# Patient Record
Sex: Female | Born: 1955 | Race: Black or African American | Hispanic: No | Smoking: Never smoker
Health system: Southern US, Community
[De-identification: ages and names within clinical notes are randomized; demographics above are authoritative.]

## PROBLEM LIST (undated history)

## (undated) DIAGNOSIS — M5126 Other intervertebral disc displacement, lumbar region: Secondary | ICD-10-CM

## (undated) DIAGNOSIS — R569 Unspecified convulsions: Secondary | ICD-10-CM

## (undated) DIAGNOSIS — F329 Major depressive disorder, single episode, unspecified: Secondary | ICD-10-CM

## (undated) DIAGNOSIS — M5417 Radiculopathy, lumbosacral region: Secondary | ICD-10-CM

## (undated) DIAGNOSIS — F32A Depression, unspecified: Secondary | ICD-10-CM

## (undated) DIAGNOSIS — E039 Hypothyroidism, unspecified: Secondary | ICD-10-CM

## (undated) DIAGNOSIS — E119 Type 2 diabetes mellitus without complications: Secondary | ICD-10-CM

## (undated) DIAGNOSIS — I10 Essential (primary) hypertension: Secondary | ICD-10-CM

## (undated) DIAGNOSIS — C801 Malignant (primary) neoplasm, unspecified: Secondary | ICD-10-CM

## (undated) DIAGNOSIS — K219 Gastro-esophageal reflux disease without esophagitis: Secondary | ICD-10-CM

## (undated) DIAGNOSIS — M797 Fibromyalgia: Secondary | ICD-10-CM

## (undated) DIAGNOSIS — N289 Disorder of kidney and ureter, unspecified: Secondary | ICD-10-CM

## (undated) DIAGNOSIS — G894 Chronic pain syndrome: Secondary | ICD-10-CM

---

## 2015-08-13 ENCOUNTER — Encounter (HOSPITAL_COMMUNITY): Payer: Self-pay | Admitting: General Practice

## 2015-08-13 ENCOUNTER — Emergency Department (HOSPITAL_COMMUNITY)
Admission: EM | Admit: 2015-08-13 | Discharge: 2015-08-13 | Disposition: A | Discharge status: Home / Self Care | Payer: Medicare Other | Attending: Emergency Medicine | Admitting: Emergency Medicine

## 2015-08-13 ENCOUNTER — Emergency Department (HOSPITAL_COMMUNITY): Payer: Medicare Other

## 2015-08-13 DIAGNOSIS — I10 Essential (primary) hypertension: Secondary | ICD-10-CM | POA: Insufficient documentation

## 2015-08-13 DIAGNOSIS — Z7984 Long term (current) use of oral hypoglycemic drugs: Secondary | ICD-10-CM | POA: Insufficient documentation

## 2015-08-13 DIAGNOSIS — Z85850 Personal history of malignant neoplasm of thyroid: Secondary | ICD-10-CM | POA: Diagnosis not present

## 2015-08-13 DIAGNOSIS — Z8742 Personal history of other diseases of the female genital tract: Secondary | ICD-10-CM | POA: Diagnosis not present

## 2015-08-13 DIAGNOSIS — Z79899 Other long term (current) drug therapy: Secondary | ICD-10-CM | POA: Insufficient documentation

## 2015-08-13 DIAGNOSIS — F329 Major depressive disorder, single episode, unspecified: Secondary | ICD-10-CM | POA: Insufficient documentation

## 2015-08-13 DIAGNOSIS — M797 Fibromyalgia: Secondary | ICD-10-CM | POA: Diagnosis not present

## 2015-08-13 DIAGNOSIS — K219 Gastro-esophageal reflux disease without esophagitis: Secondary | ICD-10-CM | POA: Insufficient documentation

## 2015-08-13 DIAGNOSIS — E119 Type 2 diabetes mellitus without complications: Secondary | ICD-10-CM | POA: Diagnosis not present

## 2015-08-13 DIAGNOSIS — Z8669 Personal history of other diseases of the nervous system and sense organs: Secondary | ICD-10-CM | POA: Insufficient documentation

## 2015-08-13 DIAGNOSIS — G894 Chronic pain syndrome: Secondary | ICD-10-CM | POA: Diagnosis not present

## 2015-08-13 DIAGNOSIS — E039 Hypothyroidism, unspecified: Secondary | ICD-10-CM | POA: Insufficient documentation

## 2015-08-13 DIAGNOSIS — R0602 Shortness of breath: Secondary | ICD-10-CM | POA: Insufficient documentation

## 2015-08-13 DIAGNOSIS — R0789 Other chest pain: Secondary | ICD-10-CM | POA: Insufficient documentation

## 2015-08-13 DIAGNOSIS — Z760 Encounter for issue of repeat prescription: Secondary | ICD-10-CM | POA: Insufficient documentation

## 2015-08-13 DIAGNOSIS — R079 Chest pain, unspecified: Secondary | ICD-10-CM | POA: Diagnosis present

## 2015-08-13 DIAGNOSIS — G8929 Other chronic pain: Secondary | ICD-10-CM

## 2015-08-13 HISTORY — DX: Unspecified convulsions: R56.9

## 2015-08-13 HISTORY — DX: Gastro-esophageal reflux disease without esophagitis: K21.9

## 2015-08-13 HISTORY — DX: Radiculopathy, lumbosacral region: M54.17

## 2015-08-13 HISTORY — DX: Other intervertebral disc displacement, lumbar region: M51.26

## 2015-08-13 HISTORY — DX: Disorder of kidney and ureter, unspecified: N28.9

## 2015-08-13 HISTORY — DX: Depression, unspecified: F32.A

## 2015-08-13 HISTORY — DX: Essential (primary) hypertension: I10

## 2015-08-13 HISTORY — DX: Chronic pain syndrome: G89.4

## 2015-08-13 HISTORY — DX: Malignant (primary) neoplasm, unspecified: C80.1

## 2015-08-13 HISTORY — DX: Hypothyroidism, unspecified: E03.9

## 2015-08-13 HISTORY — DX: Fibromyalgia: M79.7

## 2015-08-13 HISTORY — DX: Major depressive disorder, single episode, unspecified: F32.9

## 2015-08-13 HISTORY — DX: Type 2 diabetes mellitus without complications: E11.9

## 2015-08-13 LAB — CBG MONITORING, ED: GLUCOSE-CAPILLARY: 112 mg/dL — AB (ref 65–99)

## 2015-08-13 LAB — BASIC METABOLIC PANEL
ANION GAP: 8 (ref 5–15)
BUN: <5 mg/dL — ABNORMAL LOW (ref 6–20)
CHLORIDE: 106 mmol/L (ref 101–111)
CO2: 27 mmol/L (ref 22–32)
Calcium: 9.2 mg/dL (ref 8.9–10.3)
Creatinine, Ser: 0.7 mg/dL (ref 0.44–1.00)
GFR calc non Af Amer: >60 mL/min (ref >60)
Glucose, Bld: 115 mg/dL — ABNORMAL HIGH (ref 65–99)
Potassium: 3.5 mmol/L (ref 3.5–5.1)
SODIUM: 141 mmol/L (ref 135–145)

## 2015-08-13 LAB — I-STAT TROPONIN, ED
TROPONIN I, POC: 0.01 ng/mL (ref 0.00–0.08)
Troponin i, poc: 0 ng/mL (ref 0.00–0.08)

## 2015-08-13 LAB — CBC
HEMATOCRIT: 35.4 % — AB (ref 36.0–46.0)
HEMOGLOBIN: 11.1 g/dL — AB (ref 12.0–15.0)
MCH: 26.7 pg (ref 26.0–34.0)
MCHC: 31.4 g/dL (ref 30.0–36.0)
MCV: 85.3 fL (ref 78.0–100.0)
Platelets: 218 10*3/uL (ref 150–400)
RBC: 4.15 MIL/uL (ref 3.87–5.11)
RDW: 14.2 % (ref 11.5–15.5)
WBC: 7 10*3/uL (ref 4.0–10.5)

## 2015-08-13 MED ORDER — METFORMIN HCL 500 MG PO TABS: 500.0000 mg | ORAL_TABLET | Freq: Two times a day (BID) | ORAL | Status: DC

## 2015-08-13 MED ORDER — METOPROLOL TARTRATE 25 MG PO TABS: 25.0000 mg | ORAL_TABLET | Freq: Two times a day (BID) | ORAL | Status: DC

## 2015-08-13 MED ORDER — LEVOTHYROXINE SODIUM 175 MCG PO TABS: 175.0000 ug | ORAL_TABLET | Freq: Every day | ORAL | Status: DC

## 2015-08-13 MED ORDER — HYDROCHLOROTHIAZIDE 25 MG PO TABS: 25.0000 mg | ORAL_TABLET | Freq: Every day | ORAL | Status: DC

## 2015-08-13 NOTE — ED Notes (Signed)
Pt presents with complaints of chest pain and SOB that started two days. Pt reports chest pain as a sharp pain in her left chest, that is nonradiating. Pt rating pain a 4/10. Pt recently moved from [place], and does not have a primary care provider. Pt reports some nausea, but denies any vomiting.

## 2015-08-13 NOTE — ED Provider Notes (Signed)
CSN: [id number]     Arrival date & time 08/13/15  5631 History   First MD Initiated Contact with Patient 08/13/15 [phone]     Chief Complaint  Patient presents with  . Chest Pain     (Consider location/radiation/quality/duration/timing/severity/associated sxs/prior Treatment) HPI Comments: 59 year old female with history of high blood pressure, hypothyroidism, fibromyalgia, chronic pain, pain specialist in [place], reflux, thyroid cancer no active treatment except for supplementation presents for intermittent chest pain shortness of breath for the past few weeks. Clarified nursing note. Patient has been stressed recently and moved back from [place] has no primary doctor is run out of all her medications. Patient has no exertional symptoms, no cardiac history known. Takes marijuana for pain. No vomiting or abdominal pain. No specific associations of the pain. Unsure recent cardiac workup. Chronic pelvic pain similar to previous and bodyaches. Chest pain sharp at times nonradiating.  Patient is a 59 y.o. female presenting with chest pain. The history is provided by the patient.  Chest Pain Associated symptoms: shortness of breath   Associated symptoms: no abdominal pain, no back pain, no fever, no headache, not vomiting and no weakness     Past Medical History  Diagnosis Date  . Hypertension   . Hypothyroidism   . Fibromyalgia   . Renal disorder   . Diabetes mellitus without complication ([place])   . Depression   . GERD (gastroesophageal reflux disease)   . Seizures ([place])   . Cancer ([place])   . Lumbosacral neuritis   . Disc displacement, lumbar   . Chronic pain syndrome    History reviewed. No pertinent past surgical history. No family history on file. Social History  Substance Use Topics  . Smoking status: Never Smoker   . Smokeless tobacco: None  . Alcohol Use: 8.4 oz/week    14 Cans of beer per week   OB History    No data available     Review of Systems  Constitutional:  Negative for fever and chills.  HENT: Negative for congestion.   Eyes: Negative for visual disturbance.  Respiratory: Positive for shortness of breath.   Cardiovascular: Positive for chest pain. Negative for leg swelling.  Gastrointestinal: Negative for vomiting and abdominal pain.  Genitourinary: Negative for dysuria and flank pain.  Musculoskeletal: Negative for back pain, neck pain and neck stiffness.  Skin: Negative for rash.  Neurological: Negative for weakness, light-headedness and headaches.      Allergies  Darvon and Tylenol  Home Medications   Prior to Admission medications   Medication Sig Start Date End Date Taking? Authorizing Provider  busPIRone (BUSPAR) 15 MG tablet Take 15 mg by mouth 3 (three) times daily.   Yes Historical Provider, MD  desvenlafaxine (PRISTIQ) 50 MG 24 hr tablet Take 50 mg by mouth daily.   Yes Historical Provider, MD  FLUoxetine (PROZAC) 20 MG capsule Take 20 mg by mouth daily.   Yes Historical Provider, MD  pantoprazole (PROTONIX) 40 MG tablet Take 40 mg by mouth daily.   Yes Historical Provider, MD  pregabalin (LYRICA) 150 MG capsule Take 150 mg by mouth 2 (two) times daily.   Yes Historical Provider, MD  traMADol (ULTRAM) 50 MG tablet Take 50 mg by mouth every 6 (six) hours as needed.   Yes Historical Provider, MD  vitamin B-12 (CYANOCOBALAMIN) 100 MCG tablet Take 100 mcg by mouth daily.   Yes Historical Provider, MD  vitamin C (ASCORBIC ACID) 500 MG tablet Take 500 mg by mouth daily.   Yes Historical  Provider, MD  hydrochlorothiazide (HYDRODIURIL) 25 MG tablet Take 1 tablet (25 mg total) by mouth daily. 08/13/15   Elnora Morrison, MD  levothyroxine (SYNTHROID, LEVOTHROID) 175 MCG tablet Take 1 tablet (175 mcg total) by mouth daily before breakfast. 08/13/15   Elnora Morrison, MD  metFORMIN (GLUCOPHAGE) 500 MG tablet Take 1 tablet (500 mg total) by mouth 2 (two) times daily with a meal. 08/13/15   Elnora Morrison, MD  metoprolol tartrate (LOPRESSOR) 25  MG tablet Take 1 tablet (25 mg total) by mouth 2 (two) times daily. 08/13/15   Elnora Morrison, MD   BP 156/76 mmHg  Pulse 51  Resp 26  SpO2 97% Physical Exam  Constitutional: She is oriented to person, place, and time. She appears well-developed and well-nourished.  HENT:  Head: Normocephalic and atraumatic.  Eyes: Conjunctivae are normal. Right eye exhibits no discharge. Left eye exhibits no discharge.  Neck: Normal range of motion. Neck supple. No tracheal deviation present.  Cardiovascular: Normal rate and regular rhythm.   Pulmonary/Chest: Effort normal and breath sounds normal.  Abdominal: Soft. She exhibits no distension. There is no tenderness. There is no guarding.  Musculoskeletal: She exhibits no edema.  Neurological: She is alert and oriented to person, place, and time.  Skin: Skin is warm. No rash noted.  Psychiatric: She has a normal mood and affect.  Nursing note and vitals reviewed.   ED Course  Procedures (including critical care time)   EMERGENCY DEPARTMENT [place] CARDIAC EXAM "Study: Limited Ultrasound of the heart and pericardium"  INDICATIONS:Dyspnea Multiple views of the heart and pericardium are obtained with a multi-frequency probe.  PERFORMED GE:ZMOQHU  IMAGES ARCHIVED?: Yes  FINDINGS: No pericardial effusion and Normal contractility  LIMITATIONS:  Body habitus  VIEWS USED: Subcostal 4 chamber, Parasternal long axis, Parasternal short axis and Apical 4 chamber   INTERPRETATION: Cardiac activity present and Pericardial effusioin absent  COMMENT:    Labs Review Labs Reviewed  CBC - Abnormal; Notable for the following:    Hemoglobin 11.1 (*)    HCT 35.4 (*)    All other components within normal limits  BASIC METABOLIC PANEL - Abnormal; Notable for the following:    Glucose, Bld 115 (*)    BUN <5 (*)    All other components within normal limits  CBG MONITORING, ED - Abnormal; Notable for the following:    Glucose-Capillary 112 (*)    All other  components within normal limits  I-STAT TROPOININ, ED  I-STAT TROPOININ, ED  Randolm Idol, ED    Imaging Review Dg Chest 2 View  08/13/2015  CLINICAL DATA:  Chest pain. EXAM: CHEST  2 VIEW COMPARISON:  None. FINDINGS: There is mild enlargement of the cardiac silhouette. There is mild tortuosity of the thoracic aorta. Otherwise normal mediastinal contour. No pneumothorax. No pleural effusion. No pulmonary edema. Mild curvilinear opacities at both lung bases. No focal lung consolidation. Surgical clips are noted in medial bilateral lower neck. IMPRESSION: 1. Mild enlargement of the cardiac silhouette, without pulmonary edema. Cannot exclude pericardial effusion. 2. Mild curvilinear opacities at both lung bases, in keeping with scarring and/or atelectasis. No focal lung consolidation. Electronically Signed   By: Ilona Sorrel M.D.   On: 08/13/2015 11:11   I have personally reviewed and evaluated these images and lab results as part of my medical decision-making.   EKG Interpretation None     EKG reviewed sinus rhythm heart rate 64, no acute ST elevation, no ST depression, normal axis. Normal QT. MDM  Final diagnoses:  Chest pain, atypical  Medication refill  Chronic pain   Patient presents with intermittent atypical chest pain worse with stress for the past 2 weeks. No active chest pain currently. Patient is interested in local Dr. to help follow up her care and prescribed medications. Social work consult at for assistance. Plan for cardiac screen in the ER. With pain for 2 weeks atypical discussed close follow-up outpatient for further evaluation and possible stress test. Very low risk blood clot no active cancer treatment no recent surgery no leg symptoms, not requiring oxygen.  Chest x-ray reviewed mild cardiomegaly bedside ultrasound no significant effusion. Patient comes with outpatient follow-up. Social work discussed helping with medication refills.  Results and differential  diagnosis were discussed with the patient/parent/guardian. Xrays were independently reviewed by myself.  Close follow up outpatient was discussed, comfortable with the plan.   Medications - No data to display  Filed Vitals:   08/13/15 1000 08/13/15 1130 08/13/15 1145  BP: 118/68 141/63 156/76  Pulse: 67 66 51  Resp: 20 11 26   SpO2: 97% 93% 97%    Final diagnoses:  Chest pain, atypical  Medication refill  Chronic pain       Elnora Morrison, MD 08/13/15 1257

## 2015-08-13 NOTE — Clinical Social Work Note (Signed)
Clinical Social Work Assessment  Patient Details  Name: Debbie Boyer MRN: [id number] Date of Birth: [birth year]  Date of referral:  08/13/15               Reason for consult:  Insurance Barriers, Other (Comment Required) (Establishing care due to recent relocation to [facility])                Permission sought to share information with:  Case Manager, Other Permission granted to share information::  Yes, Verbal Permission Granted  Name::        Agency::     Relationship::     Contact Information:     Housing/Transportation Living arrangements for the past 2 months:  Apartment Source of Information:  Patient, Medical Team Patient Interpreter Needed:  None Criminal Activity/Legal Involvement Pertinent to Current Situation/Hospitalization:  No - Comment as needed Significant Relationships:  Adult Children, Other Family Members Lives with:  Adult Children Do you feel safe going back to the place where you live?  Yes Need for family participation in patient care:  No (Coment)  Care giving concerns:  Patient reports no concerns noted at this time except in establishing care with medical provider to keep herself out of the hospital. Reports history of multiple chronic illnesses that she takes medications for and needing care since she has recently located to [facility].  Patient lives with her adult children and grandchildren.   Social Worker assessment / plan:    LCSW met with patient and completed assessment with patient. Patient reports she will be in her new home the first of November and currently trying to get established with medical and psychiatric care within the community. No acute safety issues reported or needed at this time.  Has good support with family and open to resources per report.  LCSW will give information for referrals as well as information about switching medicaid. No barriers to DC. Patient takes bus currently as she waits for her car to ship from [place]. Bus passes given  to patient for transport.   Employment status:  Disabled (Comment on whether or not currently receiving Disability) Insurance information:  Medicare, [employer] PT Recommendations:  Not assessed at this time Information / Referral to community resources:  Outpatient Psychiatric Care (Comment Required), Other (Comment Required) (PCP in community to establish care, Medicaid information to switch Medicaid to [place] from [facility])  Patient/Family's Response to care:  Agreeable to plan Patient/Family's Understanding of and Emotional Response to Diagnosis, Current Treatment, and Prognosis:  Patient aware of her need to be established in community to prevent hospitalization and care for herself adequately.  She is being proactive and able to express her increased stress, pain, and anxiety all associated with adjusting to the new area.    Emotional Assessment Appearance:  Appears stated age Attitude/Demeanor/Rapport:  Other (Very cooperative and pleasant) Affect (typically observed):  Anxious, Stable (hopeful to establish life in [place], happy to be out of [place] per report) Orientation:  Oriented to Self, Oriented to Place, Oriented to  Time, Oriented to Situation Alcohol / Substance use:  Not Applicable Psych involvement (Current and /or in the community):  Outpatient Provider (had an outpatient provider in [facility] and in need of establishing care)  Discharge Needs  Concerns to be addressed:  Care Coordination, Financial / Insurance Concerns Readmission within the last 30 days:  No Current discharge risk:  None Barriers to Discharge:  Barriers Resolved (Community resources given to patient to establish care in  [place])   [place], LCSW 08/13/2015, 11:34 AM

## 2015-08-13 NOTE — Discharge Instructions (Signed)
If you were given medicines take as directed.  If you are on coumadin or contraceptives realize their levels and effectiveness is altered by many different medicines.  If you have any reaction (rash, tongues swelling, other) to the medicines stop taking and see a physician.    If your blood pressure was elevated in the ER make sure you follow up for management with a primary doctor or return for chest pain, shortness of breath or stroke symptoms.  Please follow up as directed and return to the ER or see a physician for new or worsening symptoms.  Thank you. Filed Vitals:   08/13/15 1000 08/13/15 1130 08/13/15 1145  BP: 118/68 141/63 156/76  Pulse: 67 66 51  Resp: 20 11 26   SpO2: 97% 93% 97%

## 2015-08-22 ENCOUNTER — Encounter (HOSPITAL_COMMUNITY): Payer: Self-pay | Admitting: General Practice

## 2015-08-22 ENCOUNTER — Emergency Department (HOSPITAL_COMMUNITY): Payer: Medicare Other

## 2015-08-22 ENCOUNTER — Emergency Department (HOSPITAL_COMMUNITY)
Admission: EM | Admit: 2015-08-22 | Discharge: 2015-08-22 | Disposition: A | Discharge status: Home / Self Care | Payer: Medicare Other | Attending: Emergency Medicine | Admitting: Emergency Medicine

## 2015-08-22 DIAGNOSIS — R0682 Tachypnea, not elsewhere classified: Secondary | ICD-10-CM | POA: Insufficient documentation

## 2015-08-22 DIAGNOSIS — R110 Nausea: Secondary | ICD-10-CM | POA: Diagnosis not present

## 2015-08-22 DIAGNOSIS — R52 Pain, unspecified: Secondary | ICD-10-CM | POA: Diagnosis present

## 2015-08-22 DIAGNOSIS — E119 Type 2 diabetes mellitus without complications: Secondary | ICD-10-CM | POA: Insufficient documentation

## 2015-08-22 DIAGNOSIS — M797 Fibromyalgia: Secondary | ICD-10-CM | POA: Insufficient documentation

## 2015-08-22 DIAGNOSIS — Z79899 Other long term (current) drug therapy: Secondary | ICD-10-CM | POA: Diagnosis not present

## 2015-08-22 DIAGNOSIS — R079 Chest pain, unspecified: Secondary | ICD-10-CM | POA: Insufficient documentation

## 2015-08-22 DIAGNOSIS — G8929 Other chronic pain: Secondary | ICD-10-CM

## 2015-08-22 DIAGNOSIS — Z87448 Personal history of other diseases of urinary system: Secondary | ICD-10-CM | POA: Diagnosis not present

## 2015-08-22 DIAGNOSIS — Z859 Personal history of malignant neoplasm, unspecified: Secondary | ICD-10-CM | POA: Diagnosis not present

## 2015-08-22 DIAGNOSIS — E039 Hypothyroidism, unspecified: Secondary | ICD-10-CM | POA: Insufficient documentation

## 2015-08-22 DIAGNOSIS — K219 Gastro-esophageal reflux disease without esophagitis: Secondary | ICD-10-CM | POA: Insufficient documentation

## 2015-08-22 DIAGNOSIS — G894 Chronic pain syndrome: Secondary | ICD-10-CM | POA: Diagnosis not present

## 2015-08-22 DIAGNOSIS — F329 Major depressive disorder, single episode, unspecified: Secondary | ICD-10-CM | POA: Diagnosis not present

## 2015-08-22 DIAGNOSIS — I10 Essential (primary) hypertension: Secondary | ICD-10-CM | POA: Diagnosis not present

## 2015-08-22 LAB — COMPREHENSIVE METABOLIC PANEL
ALT: 12 U/L — ABNORMAL LOW (ref 14–54)
ANION GAP: 13 (ref 5–15)
AST: 21 U/L (ref 15–41)
Albumin: 3.8 g/dL (ref 3.5–5.0)
Alkaline Phosphatase: 70 U/L (ref 38–126)
BUN: 6 mg/dL (ref 6–20)
CALCIUM: 10.1 mg/dL (ref 8.9–10.3)
CHLORIDE: 103 mmol/L (ref 101–111)
CO2: 25 mmol/L (ref 22–32)
Creatinine, Ser: 0.74 mg/dL (ref 0.44–1.00)
Glucose, Bld: 130 mg/dL — ABNORMAL HIGH (ref 65–99)
POTASSIUM: 2.8 mmol/L — AB (ref 3.5–5.1)
Sodium: 141 mmol/L (ref 135–145)
TOTAL PROTEIN: 8.9 g/dL — AB (ref 6.5–8.1)
Total Bilirubin: 0.6 mg/dL (ref 0.3–1.2)

## 2015-08-22 LAB — CBC
HCT: 40.2 % (ref 36.0–46.0)
HEMOGLOBIN: 13.1 g/dL (ref 12.0–15.0)
MCH: 27.5 pg (ref 26.0–34.0)
MCHC: 32.6 g/dL (ref 30.0–36.0)
MCV: 84.3 fL (ref 78.0–100.0)
PLATELETS: 239 10*3/uL (ref 150–400)
RBC: 4.77 MIL/uL (ref 3.87–5.11)
RDW: 14.6 % (ref 11.5–15.5)
WBC: 10.7 10*3/uL — ABNORMAL HIGH (ref 4.0–10.5)

## 2015-08-22 LAB — I-STAT TROPONIN, ED: TROPONIN I, POC: 0 ng/mL (ref 0.00–0.08)

## 2015-08-22 MED ORDER — ONDANSETRON 4 MG PO TBDP
4.0000 mg | ORAL_TABLET | Freq: Once | ORAL | Status: AC
  Administered 2015-08-22: 4 mg via ORAL
  Filled 2015-08-22: qty 1

## 2015-08-22 MED ORDER — ONDANSETRON HCL 4 MG/2ML IJ SOLN: 4.0000 mg | Freq: Once | INTRAMUSCULAR | Status: DC | PRN

## 2015-08-22 MED ORDER — GI COCKTAIL ~~LOC~~
30.0000 mL | Freq: Once | ORAL | Status: AC
  Administered 2015-08-22: 30 mL via ORAL
  Filled 2015-08-22: qty 30

## 2015-08-22 MED ORDER — HYDROMORPHONE HCL 1 MG/ML IJ SOLN
1.0000 mg | Freq: Once | INTRAMUSCULAR | Status: AC
  Administered 2015-08-22: 1 mg via INTRAMUSCULAR
  Filled 2015-08-22: qty 1

## 2015-08-22 MED ORDER — ASPIRIN 81 MG PO CHEW: 324.0000 mg | CHEWABLE_TABLET | Freq: Once | ORAL | Status: DC

## 2015-08-22 MED ORDER — DIPHENHYDRAMINE HCL 50 MG/ML IJ SOLN: 25.0000 mg | Freq: Once | INTRAMUSCULAR | Status: DC

## 2015-08-22 NOTE — ED Notes (Signed)
Pt presents to the emergency room complaining of pain all over. Pt has a history of fibromyalgia. Pt recently moved from [place] and doesn't have a primary care provider yet to refill her lyrica medication.

## 2015-08-22 NOTE — Discharge Instructions (Signed)
You have been seen today for overall body pain. Your imaging and lab tests showed no abnormalities. Follow up with PCP as soon as possible. Please keep your appointment scheduled for Friday, October 26. Return to ED should symptoms worsen.    Emergency Department Resource Guide 1) Find a Doctor and Pay Out of Pocket Although you won't have to find out who is covered by your insurance plan, it is a good idea to ask around and get recommendations. You will then need to call the office and see if the doctor you have chosen will accept you as a new patient and what types of options they offer for patients who are self-pay. Some doctors offer discounts or will set up payment plans for their patients who do not have insurance, but you will need to ask so you aren't surprised when you get to your appointment.  2) Contact Your Local Health Department Not all health departments have doctors that can see patients for sick visits, but many do, so it is worth a call to see if yours does. If you don't know where your local health department is, you can check in your phone book. The CDC also has a tool to help you locate your state's health department, and many state websites also have listings of all of their local health departments.  3) Find a [facility] If your illness is not likely to be very severe or complicated, you may want to try a walk in clinic. These are popping up all over the country in pharmacies, drugstores, and shopping centers. They're usually staffed by nurse practitioners or physician assistants that have been trained to treat common illnesses and complaints. They're usually fairly quick and inexpensive. However, if you have serious medical issues or chronic medical problems, these are probably not your best option.  No Primary Care Doctor: - Call Health Connect at  [phone] - they can help you locate a primary care doctor that  accepts your insurance, provides certain services,  etc. - Physician Referral Service- [phone]  Chronic Pain Problems: Organization         Address  Phone   Notes  [facility]  [phone] Patients need to be referred by their primary care doctor.   Medication Assistance: Organization         Address  Phone   Notes  [facility] Medication [facility] [address] [place], [place] [address] [phone] --Must be a resident of [facility] -- Must have NO insurance coverage whatsoever (no Medicaid/ Medicare, etc.) -- The pt. MUST have a primary care doctor that directs their care regularly and follows them in the community   [facility]ist  [phone]   [employer]  [phone]    Agencies that provide inexpensive medical care: Organization         Address  Phone   Notes  [place]  [phone]   [facility]    [phone]   [facility] [address] [place], [place] [address] [phone]   [place] [address], [place] [phone]   Planned Parenthood    [phone]   [facility]    [phone]   [place] and [place] [address], [place] Phone:  [phone], Fax:  [phone] Hours of Operation:  9 am - 6 pm, M-F.  Also accepts Medicaid/Medicare  and self-pay.  [facility] for [place] [address], [address], [place] Phone: [phone], Fax: [phone]. Hours of Operation:  8:30 am - 5:30 pm, M-F.  Also accepts Medicaid and self-pay.  [facility] [place] [address], [place] Phone: [phone]   [place], [place], [place] [phone], Ext. [phone] Mondays & Thursdays: 7-9 AM.  First 15 patients are seen on a first come, first serve basis.    [place] Providers:  Organization         Address  Phone   Notes  [facility] [address], [place] [phone] Also accepts self-pay patients.  [facility] [address], [place]  [phone]   [place], [address], [place] [phone]   [facility] [address] [place] [phone]   Lucianne Lei [address], [place]   [phone] Only accepts [place] Access [place] patients after they have their name applied to their card.   Self-Pay (no insurance) in [facility]:  Organization         Address  Phone   Notes  Sickle Cell Patients, [facility] [place] [phone]   [facility] [place] [phone]   [facility] [place]  [place], [place], [place] [phone]   [facility]/Dr. Osei-Bonsu  [address] [place] or [address], [place] [phone] Phone number for both [place] and [place] locations is the same.  [facility] [address] [place] [phone]   [facility] [address] [place] or [address] [phone]   [facility] [place] [address] [place] [phone], phone; [phone], fax Sees patients 1st and 3rd Saturday of every month.  Must not qualify for public or private insurance (i.e. Medicaid, Medicare, [place] Health Choice, Veterans' Benefits)  Household income should be no more than 200% of the poverty level The clinic cannot treat you if you are pregnant or think you are pregnant  Sexually transmitted diseases are not treated at the clinic.    Dental Care: Organization         Address  Phone  Notes  [facility] Department of [facility] [phone] Accepts children up to  age 26 who are enrolled in [place] or [place]; pregnant women with a Medicaid card; and children who have applied for Medicaid or [place] Health Choice, but were declined, whose parents can pay a reduced fee at time of service.  [facility] Department of [facility]  [address], [place] [phone] Accepts children up to age 43 who are enrolled in [place] or [place]; pregnant women with a Medicaid card; and children who have applied for Medicaid or [place] Health Choice, but were declined, whose parents can pay a reduced fee at time of service.  [facility]  [place] [phone] Patients are seen by appointment only. Walk-ins are not accepted. [place] will see patients 55 years of age and older. Monday - Tuesday (8am-5pm) Most Wednesdays (  8:30-5pm) $30 per visit, cash only  [facility] Adult [facility] Access PRO[facility]  [address], [facility] [phone] Patients are seen by appointment only. Walk-ins are not accepted. [place] will see patients 72 years of age and older. One Wednesday Evening (Monthly: Volunteer Based).  $30 per visit, cash only  [place]  [phone] for adults; Children under age 45, call Graduate Pediatric Dentistry at [phone]. Children aged 7-14, please call [phone] to request a pediatric application.  Dental services are provided in all areas of dental care including fillings, crowns and bridges, complete and partial dentures, implants, gum treatment, root canals, and extractions. Preventive care is also provided. Treatment is provided to both adults and children. Patients are selected via a lottery and there is often a waiting list.   [facility] [address], [place]  [phone] [email or web address]   [facility] [address] [place], [place] [phone], Ext. [phone] Second and Fourth Thursday of  each month, opens at 6:30 AM; Clinic ends at 9 AM.  Patients are seen on a first-come first-served basis, and a limited number are seen during each clinic.   [facility]  [address] Hillard Danker [place], [place] [phone]   Eligibility Requirements You must have lived in [place], [place], or [place] counties for at least the last three months.   You cannot be eligible for state or federal sponsored [employer], including [employer], [place], or [employer].   You generally cannot be eligible for healthcare insurance through your employer.    How to apply: Eligibility screenings are held every Tuesday and Wednesday afternoon from 1:00 pm until 4:00 pm. You do not need an appointment for the interview!  [facility] [address], [place], [place]   [place]  [place] Department  [place]  [phone]    Behavioral Health Resources in the Community: Intensive Outpatient Programs Organization         Address  Phone  Notes  [place]. [address], [place], [place] [phone]   [facility] Outpatient [address] [place], [place]   ADS: [facility] Drug [facility] [address] [place], [place]   [place] [address],  [place], [place] or [phone]   Substance Abuse Resources Organization         Address  Phone  Notes  Alcohol and Drug Services  [phone]   [place]  [phone]   [facility] [place]   Chinita Pester  [phone]   Residential & Outpatient Substance Abuse Program  [phone]   Psychological Services Organization         Address  Phone  Notes  [facility] [place]  [place]  [phone]   [place] [address],  [place] or [phone]    Mobile Crisis Teams Organization         Address  Phone  Notes  [facility], Mobile Crisis Care Unit  [phone]   [facility]ive Psychotherapeutic Services  [address] [place], [place]   [place] Levels [address] [place] [phone]    Self-Help/Support Groups Organization         Address  Phone  Notes  [facility] of [place] - variety of support groups  [place] Call for more information  Narcotics Anonymous (NA), [facility] [address], [employer] [place]  2 meetings at this location   Special educational needs teacher         Address  Phone  Notes  [facility]idential Treatment [place],    [place]  [phone]   [facility]  [address], [place] [phone], [place], [place]   [place], [place] [phone] Admissions: 8am-3pm M-F  Incentives Substance [place] [address],    [place], [place] [phone]   [facility] [address] [place], [place], [place]   [facility] [address]  [place], [place]   [facility] - Intensive Outpatient [address], [place], [place]   [facility] ([place].) [place].,  [place], [place] [phone] or [phone]   [facility] ([facility]) [address] [place], [place] Accepts Medicaid  [facility] [place] [address],  [place] [phone] Substance Abuse/Addiction Treatment   [facility] Organization         Address  Phone  Notes  [facility]  [phone]   Domenic Schwab, PhD [address] Arlis Porta [place], [place]   [phone] or [phone]   [place], [place] [phone]     [facility] [address], [place], [place] [phone] Insurance/Medicaid/sponsorship through [facility] and Families [address], [address]                                    [place], [place] [phone] [place] [address], [place] [phone]    Dr. Adele Schilder  [phone]   [facility] Dept. 1) [address], [place] 2) [place] 3)  [address], [place] [phone]  [phone]   [place] [phone] or [phone] (After Hours)

## 2015-08-22 NOTE — ED Provider Notes (Signed)
CSN: [id number]     Arrival date & time 08/22/15  0901 History   First MD Initiated Contact with Patient 08/22/15 [phone]     Chief Complaint  Patient presents with  . Pain     (Consider location/radiation/quality/duration/timing/severity/associated sxs/prior Treatment) HPI   Debbie Boyer is a 59 y.o. female, pt with chronic pain syndrome, fibromyalgia, thyroidectomy, , presents with "pain everywhere" since she was discharged from [facility] ED on 10/17. Pt states her pain is sharp, is present in her arms, shoulders, hips, legs, chest, and back, rates it 10/10, constant and non-radiating. Pt states her last tramadol was yesterday and can not remember when her last dose of Lyrica.     Past Medical History  Diagnosis Date  . Hypertension   . Hypothyroidism   . Fibromyalgia   . Renal disorder   . Diabetes mellitus without complication ([place])   . Depression   . GERD (gastroesophageal reflux disease)   . Seizures ([place])   . Cancer ([place])   . Lumbosacral neuritis   . Disc displacement, lumbar   . Chronic pain syndrome    History reviewed. No pertinent past surgical history. No family history on file. Social History  Substance Use Topics  . Smoking status: Never Smoker   . Smokeless tobacco: None  . Alcohol Use: 8.4 oz/week    14 Cans of beer per week   OB History    No data available     Review of Systems  Constitutional: Negative for fever, chills, diaphoresis and unexpected weight change.  Respiratory: Negative for cough, chest tightness and shortness of breath.   Cardiovascular: Positive for chest pain. Negative for palpitations and leg swelling.  Gastrointestinal: Positive for nausea. Negative for vomiting, abdominal pain, diarrhea and constipation.  Genitourinary: Negative for dysuria and flank pain.  Musculoskeletal: Negative for back pain.       "Pain everywhere"  Skin: Negative for color change and pallor.  Neurological: Negative for dizziness, syncope, weakness  and light-headedness.  All other systems reviewed and are negative.     Allergies  Darvon and Tylenol  Home Medications   Prior to Admission medications   Medication Sig Start Date End Date Taking? Authorizing Provider  busPIRone (BUSPAR) 15 MG tablet Take 15 mg by mouth daily.    Yes Historical Provider, MD  hydrochlorothiazide (HYDRODIURIL) 25 MG tablet Take 1 tablet (25 mg total) by mouth daily. 08/13/15  Yes Elnora Morrison, MD  levothyroxine (SYNTHROID, LEVOTHROID) 175 MCG tablet Take 1 tablet (175 mcg total) by mouth daily before breakfast. 08/13/15  Yes Elnora Morrison, MD  metFORMIN (GLUCOPHAGE) 500 MG tablet Take 1 tablet (500 mg total) by mouth 2 (two) times daily with a meal. 08/13/15  Yes Elnora Morrison, MD  metoprolol tartrate (LOPRESSOR) 25 MG tablet Take 1 tablet (25 mg total) by mouth 2 (two) times daily. 08/13/15  Yes Elnora Morrison, MD  pantoprazole (PROTONIX) 40 MG tablet Take 40 mg by mouth daily.   Yes Historical Provider, MD  pregabalin (LYRICA) 150 MG capsule Take 150 mg by mouth 2 (two) times daily.   Yes Historical Provider, MD  traMADol (ULTRAM) 50 MG tablet Take 50 mg by mouth every 6 (six) hours as needed for moderate pain.    Yes Historical Provider, MD  vitamin B-12 (CYANOCOBALAMIN) 100 MCG tablet Take 100 mcg by mouth daily.   Yes Historical Provider, MD  vitamin C (ASCORBIC ACID) 500 MG tablet Take 500 mg by mouth daily.   Yes Historical Provider,  MD   BP 152/77 mmHg  Pulse 60  Temp(Src) 99 F (37.2 C) (Oral)  Resp 13  Ht 5\' 6"  (1.676 m)  Wt 250 lb (113.399 kg)  BMI 40.37 kg/m2  SpO2 93% Physical Exam  Constitutional: She appears well-developed and well-nourished. No distress.  HENT:  Head: Normocephalic and atraumatic.  Eyes: Conjunctivae are normal. Pupils are equal, round, and reactive to light.  Neck: Normal range of motion.  Cardiovascular: Normal rate, regular rhythm and normal heart sounds.   Pulmonary/Chest: Effort normal and breath sounds  normal. Tachypnea noted. No respiratory distress.  Abdominal: Soft. Bowel sounds are normal.  Musculoskeletal: She exhibits no edema or tenderness.  Full ROM in all extremities. Pt has point tenderness to arms, shoulders, chest, back, hips, legs, and feet.   Lymphadenopathy:    She has no cervical adenopathy.  Neurological: She is alert.  No sensory deficits. Strength 5/5.   Skin: Skin is warm and dry. She is not diaphoretic.  Psychiatric:  Pt flailing her arms, rolling on the bed, and crying.  Nursing note and vitals reviewed.   ED Course  Procedures (including critical care time) Labs Review Labs Reviewed  CBC - Abnormal; Notable for the following:    WBC 10.7 (*)    All other components within normal limits  COMPREHENSIVE METABOLIC PANEL - Abnormal; Notable for the following:    Potassium 2.8 (*)    Glucose, Bld 130 (*)    Total Protein 8.9 (*)    ALT 12 (*)    All other components within normal limits  I-STAT TROPOININ, ED    Imaging Review Dg Chest 2 View  08/22/2015  CLINICAL DATA:  59 year old female with chest pain and shortness breath today. *INSERT* the hill EXAM: CHEST  2 VIEW COMPARISON:  17 2006. FINDINGS: Seated upright AP and lateral views of the chest. Large body habitus. Lower lung volumes. Stable cardiac size and mediastinal contours. Continued perihilar linear opacity which most resembles atelectasis. No pneumothorax, pulmonary edema or pleural effusion. No other confluent pulmonary opacity. No acute osseous abnormality identified. IMPRESSION: Lower lung volumes with continued atelectasis. Electronically Signed   By: Genevie Ann M.D.   On: 08/22/2015 10:00   I have personally reviewed and evaluated these images and lab results as part of my medical decision-making.   EKG Interpretation   Date/Time:  Wednesday August 22 2015 26:94:85 EDT Ventricular Rate:  70 PR Interval:  180 QRS Duration: 96 QT Interval:  541 QTC Calculation: 584 R Axis:   51 Text  Interpretation:  Sinus rhythm Nonspecific repol abnormality, inferior  leads Prolonged QT interval Baseline wander in lead(s) III aVF V1 V2 V3 V6  baseline wander Confirmed by [facility]  MD, TREY ([id number]) on 08/22/2015  9:52:39 AM      MDM   Final diagnoses:  Fibromyalgia  Chronic pain  Whole body pain    Sibel Khurana presents with acute on chronic full body pain, consistent with fibromyalgia.   Findings and plan of care discussed with Serita Grit, MD.   Pt was seen on 10/17 here in the ED, was given prescriptions for her home medications. Pt states she hasn't taken any of those. Pt has an appt for 10/28 for a PCP initial visit. Pt needs chronic management of her fibromyalgia.  9:35 AM Upon reassessment, pt resting comfortably and sleeping. Pt no longer tachypneic. Pt noted to be hypertensive upon arrival, but pt has not taken her BP medications today and at the time of  the reading was flailing her arms and crying. EKG shows sinus rhythm.  10:30 AM RN attempted to give GI cocktail, but pt spit it back up. In order to properly evaluate pt, need to administer pain medication.  Pt now pain free following dilaudid administration. Re-exam shows no complaints or abnormalities. Pt resting comfortably on bed sleeping. Pt asked to know the name of the medication she received, "for the next time." Pt will be discharged and told to keep her appt that she has with a PCP on Friday of this week. 12:49 PM Pt reassessed. Still pain free. No dizziness, neurologic deficits, or any complaints. Pt to be discharged.  Pt updated with plan of care and pt agreed.   Lorayne Bender, PA-C 08/22/15 [place], MD 08/23/15 [phone]

## 2015-08-22 NOTE — ED Notes (Signed)
Pt tossing and turning in bed, crying, and moaning in pain. Pt stated "mommie, help me". RN gave pt GI cocktail, pt spit out the GI cocktail.

## 2015-08-24 ENCOUNTER — Ambulatory Visit: Payer: Medicare Other | Attending: Family Medicine | Admitting: Family Medicine

## 2015-08-24 ENCOUNTER — Telehealth: Payer: Self-pay | Admitting: Family Medicine

## 2015-08-24 ENCOUNTER — Encounter: Payer: Self-pay | Admitting: Family Medicine

## 2015-08-24 VITALS — BP 130/84 | HR 71 | Temp 98.6°F | Resp 18 | Ht 66.5 in | Wt 266.0 lb

## 2015-08-24 DIAGNOSIS — E1169 Type 2 diabetes mellitus with other specified complication: Secondary | ICD-10-CM | POA: Diagnosis not present

## 2015-08-24 DIAGNOSIS — K219 Gastro-esophageal reflux disease without esophagitis: Secondary | ICD-10-CM | POA: Diagnosis not present

## 2015-08-24 DIAGNOSIS — E038 Other specified hypothyroidism: Secondary | ICD-10-CM

## 2015-08-24 DIAGNOSIS — M797 Fibromyalgia: Secondary | ICD-10-CM

## 2015-08-24 DIAGNOSIS — F329 Major depressive disorder, single episode, unspecified: Secondary | ICD-10-CM

## 2015-08-24 DIAGNOSIS — E039 Hypothyroidism, unspecified: Secondary | ICD-10-CM | POA: Insufficient documentation

## 2015-08-24 DIAGNOSIS — E876 Hypokalemia: Secondary | ICD-10-CM

## 2015-08-24 DIAGNOSIS — E119 Type 2 diabetes mellitus without complications: Secondary | ICD-10-CM | POA: Diagnosis present

## 2015-08-24 DIAGNOSIS — I10 Essential (primary) hypertension: Secondary | ICD-10-CM

## 2015-08-24 DIAGNOSIS — R5383 Other fatigue: Secondary | ICD-10-CM

## 2015-08-24 DIAGNOSIS — G894 Chronic pain syndrome: Secondary | ICD-10-CM | POA: Diagnosis not present

## 2015-08-24 DIAGNOSIS — F32A Depression, unspecified: Secondary | ICD-10-CM | POA: Insufficient documentation

## 2015-08-24 LAB — BASIC METABOLIC PANEL
BUN: 12 mg/dL (ref 7–25)
CALCIUM: 9.4 mg/dL (ref 8.6–10.4)
CO2: 30 mmol/L (ref 20–31)
CREATININE: 0.76 mg/dL (ref 0.50–0.99)
Chloride: 99 mmol/L (ref 98–110)
GLUCOSE: 113 mg/dL — AB (ref 65–99)
Potassium: 3.1 mmol/L — ABNORMAL LOW (ref 3.5–5.3)
Sodium: 139 mmol/L (ref 135–146)

## 2015-08-24 LAB — POCT GLYCOSYLATED HEMOGLOBIN (HGB A1C): HEMOGLOBIN A1C: 5.6

## 2015-08-24 LAB — TSH: TSH: 0.037 u[IU]/mL — ABNORMAL LOW (ref 0.350–4.500)

## 2015-08-24 LAB — GLUCOSE, POCT (MANUAL RESULT ENTRY): POC Glucose: 143 mg/dl — AB (ref 70–99)

## 2015-08-24 MED ORDER — LEVOTHYROXINE SODIUM 175 MCG PO TABS: 175.0000 ug | ORAL_TABLET | Freq: Every day | ORAL | Status: DC

## 2015-08-24 MED ORDER — GABAPENTIN 300 MG PO CAPS: 300.0000 mg | ORAL_CAPSULE | Freq: Two times a day (BID) | ORAL | Status: AC

## 2015-08-24 MED ORDER — BUSPIRONE HCL 15 MG PO TABS: 15.0000 mg | ORAL_TABLET | Freq: Every day | ORAL | Status: AC

## 2015-08-24 MED ORDER — PREGABALIN 150 MG PO CAPS: 150.0000 mg | ORAL_CAPSULE | Freq: Two times a day (BID) | ORAL | Status: AC

## 2015-08-24 MED ORDER — PANTOPRAZOLE SODIUM 40 MG PO TBEC: 40.0000 mg | DELAYED_RELEASE_TABLET | Freq: Every day | ORAL | Status: AC

## 2015-08-24 MED ORDER — HYDROCHLOROTHIAZIDE 25 MG PO TABS: 25.0000 mg | ORAL_TABLET | Freq: Every day | ORAL | Status: AC

## 2015-08-24 MED ORDER — FLUOXETINE HCL 20 MG PO TABS: 20.0000 mg | ORAL_TABLET | Freq: Every day | ORAL | Status: DC

## 2015-08-24 MED ORDER — METOPROLOL TARTRATE 25 MG PO TABS: 25.0000 mg | ORAL_TABLET | Freq: Two times a day (BID) | ORAL | Status: AC

## 2015-08-24 MED ORDER — TRAMADOL HCL 50 MG PO TABS: 50.0000 mg | ORAL_TABLET | Freq: Four times a day (QID) | ORAL | Status: AC | PRN

## 2015-08-24 MED ORDER — METFORMIN HCL 500 MG PO TABS: 500.0000 mg | ORAL_TABLET | Freq: Two times a day (BID) | ORAL | Status: AC

## 2015-08-24 NOTE — Progress Notes (Signed)
Pt's wants  to est. care with a PCP.  Pt's here for hospital f/up chest pain and fibromyalgia, and rates the pain at level 8/10.   Pt reports taking meds this morning before visit.  Pt says she's a diabetic and just moved here from [place] x76mo ago.  Pt requesting refills on medication.

## 2015-08-24 NOTE — Progress Notes (Signed)
Subjective:  Patient ID: Debbie Boyer, female    DOB: [birth year]  Age: 59 y.o. MRN: [id number]  CC: Hospitalization Follow-up   HPI Debbie Boyer is a 59 year old woman who relocated from [place] a month ago and presents for an ED follow-up after she had presented to [facility] ED on 2 separate occassions-08/13/15 and 08/22/15 with chest pains initially and later on generalized body aches. Medical history is significant for type 2 diabetes mellitus, fibromyalgia, GERD, thyroid cancer (status post thyroidectomy), secondary hypothyroidism, depression, chronic pelvic pain. Cardiac enzymes were negative, EKG was unremarkable, chest x-ray showed mild enlargement of the cardiac silhouette without pulmonary edema, opacities in lung bases in keeping with scattering and/or atelectasis and repeat chest x-ray the week after revealed lower lung volumes , continued atelectasis. Chest pain was thought to be atypical. At her most recent visit she received Dilaudid after which she became pain-free and was subsequently discharged.  Interval history: Patient is crying and moaning throughout the whole encounter and complains of hurting all over her body. She tells me she had throat cancer and not thyroid cancer (even though she is status post thyroidectomy ) and records are not available to [place] at this time. She has run out of her antidepressant and Wellbutrin and is needing a refill of all her medications. She informs me that moaning takes her mind off pain.  Complains of feeling tired and states she thinks her kidneys "acting up" because they "act up", whenever she is in pain.`But she denies dysuria or other urinary symptoms. She is requesting a referral to pain management.  Outpatient Prescriptions Prior to Visit  Medication Sig Dispense Refill  . vitamin B-12 (CYANOCOBALAMIN) 100 MCG tablet Take 100 mcg by mouth daily.    . vitamin C (ASCORBIC ACID) 500 MG tablet Take 500 mg by mouth daily.    .  busPIRone (BUSPAR) 15 MG tablet Take 15 mg by mouth daily.     . hydrochlorothiazide (HYDRODIURIL) 25 MG tablet Take 1 tablet (25 mg total) by mouth daily. 30 tablet 0  . levothyroxine (SYNTHROID, LEVOTHROID) 175 MCG tablet Take 1 tablet (175 mcg total) by mouth daily before breakfast. 30 tablet 0  . metFORMIN (GLUCOPHAGE) 500 MG tablet Take 1 tablet (500 mg total) by mouth 2 (two) times daily with a meal. 60 tablet 0  . metoprolol tartrate (LOPRESSOR) 25 MG tablet Take 1 tablet (25 mg total) by mouth 2 (two) times daily. 30 tablet 0  . pantoprazole (PROTONIX) 40 MG tablet Take 40 mg by mouth daily.    . pregabalin (LYRICA) 150 MG capsule Take 150 mg by mouth 2 (two) times daily.    . traMADol (ULTRAM) 50 MG tablet Take 50 mg by mouth every 6 (six) hours as needed for moderate pain.      No facility-administered medications prior to visit.    ROS Review of Systems  Constitutional: Positive for fatigue. Negative for activity change and appetite change.       Myalgias  HENT: Negative for congestion, sinus pressure and sore throat.   Eyes: Negative for visual disturbance.  Respiratory: Negative for cough, chest tightness, shortness of breath and wheezing.   Cardiovascular: Negative for chest pain and palpitations.  Gastrointestinal: Negative for abdominal pain, constipation and abdominal distention.  Endocrine: Negative for polydipsia.  Genitourinary: Positive for pelvic pain. Negative for dysuria and frequency.  Musculoskeletal: Positive for back pain and arthralgias.  Skin: Negative for rash.  Neurological: Negative for tremors, light-headedness and numbness.  Hematological: Does not bruise/bleed easily.  Psychiatric/Behavioral: Positive for dysphoric mood. Negative for suicidal ideas, behavioral problems and agitation.    Objective:  BP 130/84 mmHg  Pulse 71  Temp(Src) 98.6 F (37 C) (Oral)  Resp 18  Ht 5' 6.5" (1.689 m)  Wt 266 lb (120.657 kg)  BMI 42.30 kg/m2  SpO2  96%  BP/Weight 08/24/2015 08/22/2015 38/07/1750  Systolic BP 025 852 778  Diastolic BP 84 82 78  Wt. (Lbs) 266 250 -  BMI 42.3 40.37 -    Lab Results  Component Value Date   WBC 10.7* 08/22/2015   HGB 13.1 08/22/2015   HCT 40.2 08/22/2015   PLT 239 08/22/2015   GLUCOSE 130* 08/22/2015   ALT 12* 08/22/2015   AST 21 08/22/2015   NA 141 08/22/2015   K 2.8* 08/22/2015   CL 103 08/22/2015   CREATININE 0.74 08/22/2015   BUN 6 08/22/2015   CO2 25 08/22/2015   HGBA1C 5.60 08/24/2015    Physical Exam  Constitutional: She is oriented to person, place, and time. She appears well-developed and well-nourished.  Neck: Normal range of motion.  Cardiovascular: Normal rate, normal heart sounds and intact distal pulses.   No murmur heard. Pulmonary/Chest: Effort normal and breath sounds normal. She has no wheezes. She has no rales. She exhibits no tenderness.  Abdominal: Soft. Bowel sounds are normal. She exhibits no distension and no mass.  Musculoskeletal: Normal range of motion.  Neurological: She is alert and oriented to person, place, and time.  Psychiatric:  Patient is extremely upset and is in tears throughout the examination.     Assessment & Plan:   1. Type 2 diabetes mellitus with other specified complication (HCC) Controlled with A1c of 5.6 - Glucose (CBG) - POCT A1C - Microalbumin/Creatinine Ratio, Urine - metFORMIN (GLUCOPHAGE) 500 MG tablet; Take 1 tablet (500 mg total) by mouth 2 (two) times daily with a meal.  Dispense: 60 tablet; Refill: 2  2. Fibromyalgia Urine drug screen sent off. Patient has been moaning while I am in the room but once I got out and listened from the outside the moaning and crying stopped - Ambulatory referral to Pain Clinic - pregabalin (LYRICA) 150 MG capsule; Take 1 capsule (150 mg total) by mouth 2 (two) times daily.  Dispense: 60 capsule; Refill: 2 - traMADol (ULTRAM) 50 MG tablet; Take 1 tablet (50 mg total) by mouth every 6 (six) hours  as needed for moderate pain.  Dispense: 60 tablet; Refill: 2 - Drug Screen, Urine  3. Chronic pain syndroms - Ambulatory referral to Pain Clinic - traMADol (ULTRAM) 50 MG tablet; Take 1 tablet (50 mg total) by mouth every 6 (six) hours as needed for moderate pain.  Dispense: 60 tablet; Refill: 2 - Drug Screen, Urine  4. Hypokalemia She states she was previously on potassium replacement which I will resume after labs have been obtained - Basic Metabolic Panel  5. Essential hypertension Controlled - metoprolol tartrate (LOPRESSOR) 25 MG tablet; Take 1 tablet (25 mg total) by mouth 2 (two) times daily.  Dispense: 30 tablet; Refill: 2 - hydrochlorothiazide (HYDRODIURIL) 25 MG tablet; Take 1 tablet (25 mg total) by mouth daily.  Dispense: 30 tablet; Refill: 2  6. Other specified hypothyroidism - TSH - levothyroxine (SYNTHROID, LEVOTHROID) 175 MCG tablet; Take 1 tablet (175 mcg total) by mouth daily before breakfast.  Dispense: 30 tablet; Refill: 2  7. Other fatigue She could have a component of chronic fatigue syndrome versus withdrawal due to being  off Prozac and Wellbutrin. We'll see back at her next visit for improvement in symptoms.  8. Depression Uncontrolled as she has been off her medications - busPIRone (BUSPAR) 15 MG tablet; Take 1 tablet (15 mg total) by mouth daily.  Dispense: 30 tablet; Refill: 2 - FLUoxetine (PROZAC) 20 MG tablet; Take 1 tablet (20 mg total) by mouth daily.  Dispense: 30 tablet; Refill: 2  9. Gastroesophageal reflux disease without esophagitis - pantoprazole (PROTONIX) 40 MG tablet; Take 1 tablet (40 mg total) by mouth daily.  Dispense: 30 tablet; Refill: 2   Meds ordered this encounter  Medications  . busPIRone (BUSPAR) 15 MG tablet    Sig: Take 1 tablet (15 mg total) by mouth daily.    Dispense:  30 tablet    Refill:  2  . pregabalin (LYRICA) 150 MG capsule    Sig: Take 1 capsule (150 mg total) by mouth 2 (two) times daily.    Dispense:  60 capsule     Refill:  2  . levothyroxine (SYNTHROID, LEVOTHROID) 175 MCG tablet    Sig: Take 1 tablet (175 mcg total) by mouth daily before breakfast.    Dispense:  30 tablet    Refill:  2  . FLUoxetine (PROZAC) 20 MG tablet    Sig: Take 1 tablet (20 mg total) by mouth daily.    Dispense:  30 tablet    Refill:  2  . metoprolol tartrate (LOPRESSOR) 25 MG tablet    Sig: Take 1 tablet (25 mg total) by mouth 2 (two) times daily.    Dispense:  30 tablet    Refill:  2  . hydrochlorothiazide (HYDRODIURIL) 25 MG tablet    Sig: Take 1 tablet (25 mg total) by mouth daily.    Dispense:  30 tablet    Refill:  2  . pantoprazole (PROTONIX) 40 MG tablet    Sig: Take 1 tablet (40 mg total) by mouth daily.    Dispense:  30 tablet    Refill:  2  . traMADol (ULTRAM) 50 MG tablet    Sig: Take 1 tablet (50 mg total) by mouth every 6 (six) hours as needed for moderate pain.    Dispense:  60 tablet    Refill:  2  . metFORMIN (GLUCOPHAGE) 500 MG tablet    Sig: Take 1 tablet (500 mg total) by mouth 2 (two) times daily with a meal.    Dispense:  60 tablet    Refill:  2    Follow-up: Return in about 2 weeks (around 09/07/2015) for follow up of Fibromyalgia with Dr Jarold Song.   Arnoldo Morale MD

## 2015-08-24 NOTE — Telephone Encounter (Signed)
Patient is here and needs some samples of Lyrica provided. Please contact the patient once ready if possible. Thank you, Fonda Kinder, ASA

## 2015-08-24 NOTE — Patient Instructions (Signed)
Hypertension Hypertension, commonly called high blood pressure, is when the force of blood pumping through your arteries is too strong. Your arteries are the blood vessels that carry blood from your heart throughout your body. A blood pressure reading consists of a higher number over a lower number, such as 110/72. The higher number (systolic) is the pressure inside your arteries when your heart pumps. The lower number (diastolic) is the pressure inside your arteries when your heart relaxes. Ideally you want your blood pressure below 120/80. Hypertension forces your heart to work harder to pump blood. Your arteries may become narrow or stiff. Having untreated or uncontrolled hypertension can cause heart attack, stroke, kidney disease, and other problems. RISK FACTORS Some risk factors for high blood pressure are controllable. Others are not.  Risk factors you cannot control include:   Race. You may be at higher risk if you are African American.  Age. Risk increases with age.  Gender. Men are at higher risk than women before age 45 years. After age 65, women are at higher risk than men. Risk factors you can control include:  Not getting enough exercise or physical activity.  Being overweight.  Getting too much fat, sugar, calories, or salt in your diet.  Drinking too much alcohol. SIGNS AND SYMPTOMS Hypertension does not usually cause signs or symptoms. Extremely high blood pressure (hypertensive crisis) may cause headache, anxiety, shortness of breath, and nosebleed. DIAGNOSIS To check if you have hypertension, your health care provider will measure your blood pressure while you are seated, with your arm held at the level of your heart. It should be measured at least twice using the same arm. Certain conditions can cause a difference in blood pressure between your right and left arms. A blood pressure reading that is higher than normal on one occasion does not mean that you need treatment. If  it is not clear whether you have high blood pressure, you may be asked to return on a different day to have your blood pressure checked again. Or, you may be asked to monitor your blood pressure at home for 1 or more weeks. TREATMENT Treating high blood pressure includes making lifestyle changes and possibly taking medicine. Living a healthy lifestyle can help lower high blood pressure. You may need to change some of your habits. Lifestyle changes may include:  Following the DASH diet. This diet is high in fruits, vegetables, and whole grains. It is low in salt, red meat, and added sugars.  Keep your sodium intake below 2,300 mg per day.  Getting at least 30-45 minutes of aerobic exercise at least 4 times per week.  Losing weight if necessary.  Not smoking.  Limiting alcoholic beverages.  Learning ways to reduce stress. Your health care provider may prescribe medicine if lifestyle changes are not enough to get your blood pressure under control, and if one of the following is true:  You are 18-59 years of age and your systolic blood pressure is above 140.  You are 60 years of age or older, and your systolic blood pressure is above 150.  Your diastolic blood pressure is above 90.  You have diabetes, and your systolic blood pressure is over 140 or your diastolic blood pressure is over 90.  You have kidney disease and your blood pressure is above 140/90.  You have heart disease and your blood pressure is above 140/90. Your personal target blood pressure may vary depending on your medical conditions, your age, and other factors. HOME CARE INSTRUCTIONS    Have your blood pressure rechecked as directed by your health care provider.   Take medicines only as directed by your health care provider. Follow the directions carefully. Blood pressure medicines must be taken as prescribed. The medicine does not work as well when you skip doses. Skipping doses also puts you at risk for  problems.  Do not smoke.   Monitor your blood pressure at home as directed by your health care provider. SEEK MEDICAL CARE IF:   You think you are having a reaction to medicines taken.  You have recurrent headaches or feel dizzy.  You have swelling in your ankles.  You have trouble with your vision. SEEK IMMEDIATE MEDICAL CARE IF:  You develop a severe headache or confusion.  You have unusual weakness, numbness, or feel faint.  You have severe chest or abdominal pain.  You vomit repeatedly.  You have trouble breathing. MAKE SURE YOU:   Understand these instructions.  Will watch your condition.  Will get help right away if you are not doing well or get worse.   This information is not intended to replace advice given to you by your health care provider. Make sure you discuss any questions you have with your health care provider.   Document Released: 10/13/2005 Document Revised: 02/27/2015 Document Reviewed: 08/05/2013 Elsevier Interactive Patient Education 2016 Elsevier Inc.  

## 2015-08-25 LAB — MICROALBUMIN / CREATININE URINE RATIO
Creatinine, Urine: 408 mg/dL — ABNORMAL HIGH (ref 20–320)
Microalb Creat Ratio: 5 mcg/mg creat (ref <30)
Microalb, Ur: 2.2 mg/dL

## 2015-08-27 ENCOUNTER — Other Ambulatory Visit: Payer: Medicare Other

## 2015-08-27 ENCOUNTER — Other Ambulatory Visit: Payer: Self-pay | Admitting: Family Medicine

## 2015-08-27 DIAGNOSIS — E038 Other specified hypothyroidism: Secondary | ICD-10-CM

## 2015-08-27 MED ORDER — POTASSIUM CHLORIDE CRYS ER 20 MEQ PO TBCR
20.0000 meq | EXTENDED_RELEASE_TABLET | Freq: Every day | ORAL | Status: AC
Start: 2015-08-27 — End: ?

## 2015-08-27 MED ORDER — LEVOTHYROXINE SODIUM 150 MCG PO TABS: 150.0000 ug | ORAL_TABLET | Freq: Every day | ORAL | Status: AC

## 2015-08-27 NOTE — Telephone Encounter (Signed)
CMA called pt. Pt was not available to take my call. I left a message for the pt to return my call asap.

## 2015-08-28 LAB — DRUG SCREEN, URINE
Amphetamine Screen, Ur: NEGATIVE
Barbiturate Quant, Ur: NEGATIVE
Benzodiazepines.: NEGATIVE
CREATININE, U: 421.6 mg/dL
Cocaine Metabolites: NEGATIVE
Marijuana Metabolite: POSITIVE — AB
Methadone: NEGATIVE
Opiates: NEGATIVE
PHENCYCLIDINE (PCP): NEGATIVE
PROPOXYPHENE: NEGATIVE

## 2015-08-30 ENCOUNTER — Other Ambulatory Visit: Payer: Self-pay | Admitting: Family Medicine

## 2015-08-30 MED ORDER — CITALOPRAM HYDROBROMIDE 20 MG PO TABS: 20.0000 mg | ORAL_TABLET | Freq: Every day | ORAL | Status: AC

## 2015-09-04 ENCOUNTER — Telehealth: Payer: Self-pay

## 2015-09-04 NOTE — Telephone Encounter (Signed)
-----   Message from Arnoldo Morale, MD sent at 08/27/2015 11:24 AM EDT ----- Please inform her that her TSH is abnormal and so i have decreased her dose of Levothyroxine; potassium is also low and so i have placed her on potassium replacement.

## 2015-09-04 NOTE — Telephone Encounter (Signed)
CMA called pt, pt wasn't available to take my call. Left a message for pt to call me asap.

## 2015-09-06 NOTE — Telephone Encounter (Signed)
CMA called pt, pt verified name and DOB. Pt was giving lab results and verbalized that she understood with no further questions.

## 2015-09-11 ENCOUNTER — Telehealth: Payer: Self-pay

## 2015-09-11 NOTE — Telephone Encounter (Signed)
Nurse called [facility] on [address] [place] at [phone].  Nurse called pharmacy to see if patient has picked up prescription for fluoxetine 20mg  tablets. Per pharmacy, patient has not picked up prescription and patient called pharmacy today asking for tramadol. Nurse will call insurance company to find out preferred medication for fluoxetine. Patient received printed prescription for Tramadol on 08/24/15. Nurse will call patient to check on prescription.

## 2015-09-11 NOTE — Telephone Encounter (Signed)
Nurse called patient, reached voicemail on home number, unable to leave message. Nurse called patient at mobile number, Left message for patient to call Nira Conn with [facility], at [phone]. Nurse called patient to see what she did with her tramadol prescription that was printed on October 28.

## 2015-09-14 ENCOUNTER — Other Ambulatory Visit: Payer: Self-pay | Admitting: Family Medicine

## 2015-09-16 ENCOUNTER — Emergency Department (HOSPITAL_COMMUNITY)
Admission: EM | Admit: 2015-09-16 | Discharge: 2015-09-27 | Disposition: E | Payer: Medicare Other | Attending: Emergency Medicine | Admitting: Emergency Medicine

## 2015-09-16 ENCOUNTER — Encounter (HOSPITAL_COMMUNITY): Payer: Self-pay | Admitting: *Deleted

## 2015-09-16 DIAGNOSIS — F329 Major depressive disorder, single episode, unspecified: Secondary | ICD-10-CM | POA: Insufficient documentation

## 2015-09-16 DIAGNOSIS — G894 Chronic pain syndrome: Secondary | ICD-10-CM | POA: Diagnosis not present

## 2015-09-16 DIAGNOSIS — Z8589 Personal history of malignant neoplasm of other organs and systems: Secondary | ICD-10-CM | POA: Diagnosis not present

## 2015-09-16 DIAGNOSIS — Z87448 Personal history of other diseases of urinary system: Secondary | ICD-10-CM | POA: Diagnosis not present

## 2015-09-16 DIAGNOSIS — Z8739 Personal history of other diseases of the musculoskeletal system and connective tissue: Secondary | ICD-10-CM | POA: Diagnosis not present

## 2015-09-16 DIAGNOSIS — I469 Cardiac arrest, cause unspecified: Secondary | ICD-10-CM | POA: Insufficient documentation

## 2015-09-16 DIAGNOSIS — I10 Essential (primary) hypertension: Secondary | ICD-10-CM | POA: Insufficient documentation

## 2015-09-16 DIAGNOSIS — E119 Type 2 diabetes mellitus without complications: Secondary | ICD-10-CM | POA: Insufficient documentation

## 2015-09-16 DIAGNOSIS — E039 Hypothyroidism, unspecified: Secondary | ICD-10-CM | POA: Diagnosis not present

## 2015-09-16 DIAGNOSIS — K219 Gastro-esophageal reflux disease without esophagitis: Secondary | ICD-10-CM | POA: Insufficient documentation

## 2015-09-16 DIAGNOSIS — Z79899 Other long term (current) drug therapy: Secondary | ICD-10-CM | POA: Diagnosis not present

## 2015-09-16 MED FILL — Medication: Qty: 1 | Status: AC

## 2015-09-18 ENCOUNTER — Ambulatory Visit: Payer: Medicare Other | Admitting: Family Medicine

## 2015-09-27 NOTE — Code Documentation (Signed)
cpr continues  edp mand  Ed res at bedside.  Pulses present with compressioins.  Ems has been working  Constant for 45 minutes.

## 2015-09-27 NOTE — ED Notes (Signed)
Pt brought in by gems from homw  Down time   1345  Has been worked for 45 additional minutes.  Bright red blood from oral    V rib and pea.  shicked [facility] cardiac drugs  cpr continues

## 2015-09-27 NOTE — ED Notes (Signed)
Pt arrived with cpr in progress.  The pt has not felt well for 2-3 days with vomiting during last pm.    She was last seen alive at 1345.  The daughter found her lying in the floor approx 1415  .  The daughter started cpr.  When ems arrived she had v-fib.  She was shocked x 5  360.   Amiodarone 450 iv given narcan 4mg  iv  Hx chronic pain.  2000 cold saline by ems initially.  Gems  Treating.  King airway initially  .  Copious amounts of bright red blood coming from mouth and her airway..   When the king was removed and intubated here   The bright red  Blood continued to  Come from the airway.    Rectal bleeding approx  434ml  Bright red also    In  And out of pes.  Until pronounced by dr Reather Converse at  [phone]

## 2015-09-27 NOTE — ED Notes (Signed)
[place] donor wants to be called back with additional information    Number [id number]  Holding for eyes and tissue

## 2015-09-27 NOTE — Code Documentation (Signed)
cpr continued. 

## 2015-09-27 NOTE — Progress Notes (Signed)
Chaplain met family in waiting room and escorted them to consultation room.  Sat with them while [facility] gave information of pt. Death.  Daughter was unconsolable...sat with her. Daughter eventually went to triage and was evaluated.  Sister of patient also had to go to triage, then was admitted to E44.   Chaplain escorted family and friends to view patient's body, had prayer with them.  Comfort measures and spiritual conversation. Escorted patient's sister to E28. Continued conversation with Patient's daughter re: funeral home...gave her information on cremation facilities as requested. Will alert oncoming chaplain for follow-up.  Rev. [place], [place]

## 2015-09-27 NOTE — ED Notes (Signed)
Eye prep to pt.  Doctor signing the death certificate to be determined by dr Reather Converse.  Will call to bedcontrol when doctors name is received

## 2015-09-27 NOTE — ED Provider Notes (Signed)
CSN: [id number]     Arrival date & time 10/11/2015  1540 History   First MD Initiated Contact with Patient 10/11/2015 1556     Chief Complaint  Patient presents with  . Cardiac Arrest    Patient is a 59 y.o. female presenting with general illness. The history is provided by the EMS personnel.  Illness Quality:  Witnessed arrest Onset quality:  Sudden Duration: 45 minutes. Timing:  Constant Chronicity:  New Context:  Sitting with family Relieved by:  Nothign Ineffective treatments:  Cpr, defibrillation, amiodarone, epinephrine, assisted ventilations   Past Medical History  Diagnosis Date  . Hypertension   . Hypothyroidism   . Fibromyalgia   . Renal disorder   . Diabetes mellitus without complication ([place])   . Depression   . GERD (gastroesophageal reflux disease)   . Seizures ([place])   . Cancer ([place])   . Lumbosacral neuritis   . Disc displacement, lumbar   . Chronic pain syndrome    History reviewed. No pertinent past surgical history. No family history on file. Social History  Substance Use Topics  . Smoking status: Never Smoker   . Smokeless tobacco: None  . Alcohol Use: 8.4 oz/week    14 Cans of beer per week   OB History    No data available     Review of Systems  Unable to perform ROS: Patient unresponsive      Allergies  Darvon and Tylenol  Home Medications   Prior to Admission medications   Medication Sig Start Date End Date Taking? Authorizing Provider  busPIRone (BUSPAR) 15 MG tablet Take 1 tablet (15 mg total) by mouth daily. 08/24/15   Arnoldo Morale, MD  citalopram (CELEXA) 20 MG tablet Take 1 tablet (20 mg total) by mouth daily. 08/30/15   Arnoldo Morale, MD  gabapentin (NEURONTIN) 300 MG capsule Take 1 capsule (300 mg total) by mouth 2 (two) times daily. 08/24/15   Arnoldo Morale, MD  hydrochlorothiazide (HYDRODIURIL) 25 MG tablet Take 1 tablet (25 mg total) by mouth daily. 08/24/15   Arnoldo Morale, MD  levothyroxine (SYNTHROID, LEVOTHROID) 150 MCG  tablet Take 1 tablet (150 mcg total) by mouth daily before breakfast. Discontinue previous dose 08/27/15   Arnoldo Morale, MD  metFORMIN (GLUCOPHAGE) 500 MG tablet Take 1 tablet (500 mg total) by mouth 2 (two) times daily with a meal. 08/24/15   Arnoldo Morale, MD  metoprolol tartrate (LOPRESSOR) 25 MG tablet Take 1 tablet (25 mg total) by mouth 2 (two) times daily. 08/24/15   Arnoldo Morale, MD  pantoprazole (PROTONIX) 40 MG tablet Take 1 tablet (40 mg total) by mouth daily. 08/24/15   Arnoldo Morale, MD  potassium chloride SA (K-DUR,KLOR-CON) 20 MEQ tablet Take 1 tablet (20 mEq total) by mouth daily. 08/27/15   Arnoldo Morale, MD  pregabalin (LYRICA) 150 MG capsule Take 1 capsule (150 mg total) by mouth 2 (two) times daily. 08/24/15   Arnoldo Morale, MD  traMADol (ULTRAM) 50 MG tablet Take 1 tablet (50 mg total) by mouth every 6 (six) hours as needed for moderate pain. 08/24/15   Arnoldo Morale, MD  vitamin B-12 (CYANOCOBALAMIN) 100 MCG tablet Take 100 mcg by mouth daily.    Historical Provider, MD  vitamin C (ASCORBIC ACID) 500 MG tablet Take 500 mg by mouth daily.    Historical Provider, MD   BP 0/0 mmHg  Pulse 0  Resp 0  SpO2 0% Physical Exam  Constitutional: She appears well-developed and well-nourished.  Unresponsive, CPR in progress  HENT:  Head: Normocephalic and atraumatic.  Eyes:  Pupils fixed and dilated  Neck: No tracheal deviation present.  Cardiovascular:  pulseless  Pulmonary/Chest: She has no decreased breath sounds.  Respiratory arrest  Abdominal: She exhibits no distension.  Neurological:  GCS 3T, no seizure activity, no posturing  Skin: Skin is warm and dry.    ED Course  Procedures  None   MDM   Final diagnoses:  Cardiac arrest [facility])   34-year-old obese And medical female with a history of hypertension, kidney disease, diabetes, GERD who presents in cardiac arrest. EMS, patient was found down at home after a witnessed arrest by patient's daughter. Daughter perform CPR  for approximately 20 minutes until EMS arrived. EMS performed CPR for approximately 25 minutes. Patient was initially in V. fib arrest and was defibrillated at least 5 times in route. Patient received 15 rounds of epinephrine and a total of 450 mg of amiodarone. On arrival patient has a supraglottic airway device in place. She has an initial rhythm of V. fib. Patient was defibrillated at 200 J. CPR was continued. Next rhythm check showed asystole. Resuscitative efforts were continued. Final rhythm was noted to be PDA with a rate of 20. Patient had no cardiac activity on bedside ultrasound. After approximately one hour of resuscitation the code was called at 1550. Family was updated along with the chaplain.  Discussed with Dr. Reather Converse.  Gustavus Bryant, MD 07-Oct-2015 [id number]  Elnora Morrison, MD October 07, 2015 2200

## 2015-09-27 NOTE — Code Documentation (Signed)
Still only has an io  cpr continues.  Pulse with compressions only

## 2015-09-27 NOTE — Code Documentation (Signed)
cpr stopped for pulse check  .  [place] of her heart by dr Reather Converse

## 2015-09-27 NOTE — Code Documentation (Signed)
Io only attempting  ivs

## 2015-09-27 NOTE — Code Documentation (Signed)
Shocked  Asystole    200  watts

## 2015-09-27 NOTE — ED Notes (Signed)
Contact person [place] donor April shore

## 2015-09-27 NOTE — Code Documentation (Signed)
Intubated by the  Ed res.

## 2015-09-27 NOTE — Code Documentation (Signed)
epine bristojet iv [place] rn.  edp attempting an [place] iv.

## 2015-09-27 NOTE — ED Notes (Signed)
[place] donor service notified

## 2015-09-27 NOTE — Code Documentation (Signed)
epine bristojet by paramedic iv

## 2015-09-27 NOTE — Code Documentation (Signed)
rhy asystole  Resume cpr.

## 2015-09-27 DEATH — deceased

## 2016-08-12 IMAGING — CR DG CHEST 2V
2 series · 2 of 2 positions shown · non-contrast
Comparison: None.

CLINICAL DATA: Chest pain.

EXAM:
CHEST  2 VIEW

[chest pa]
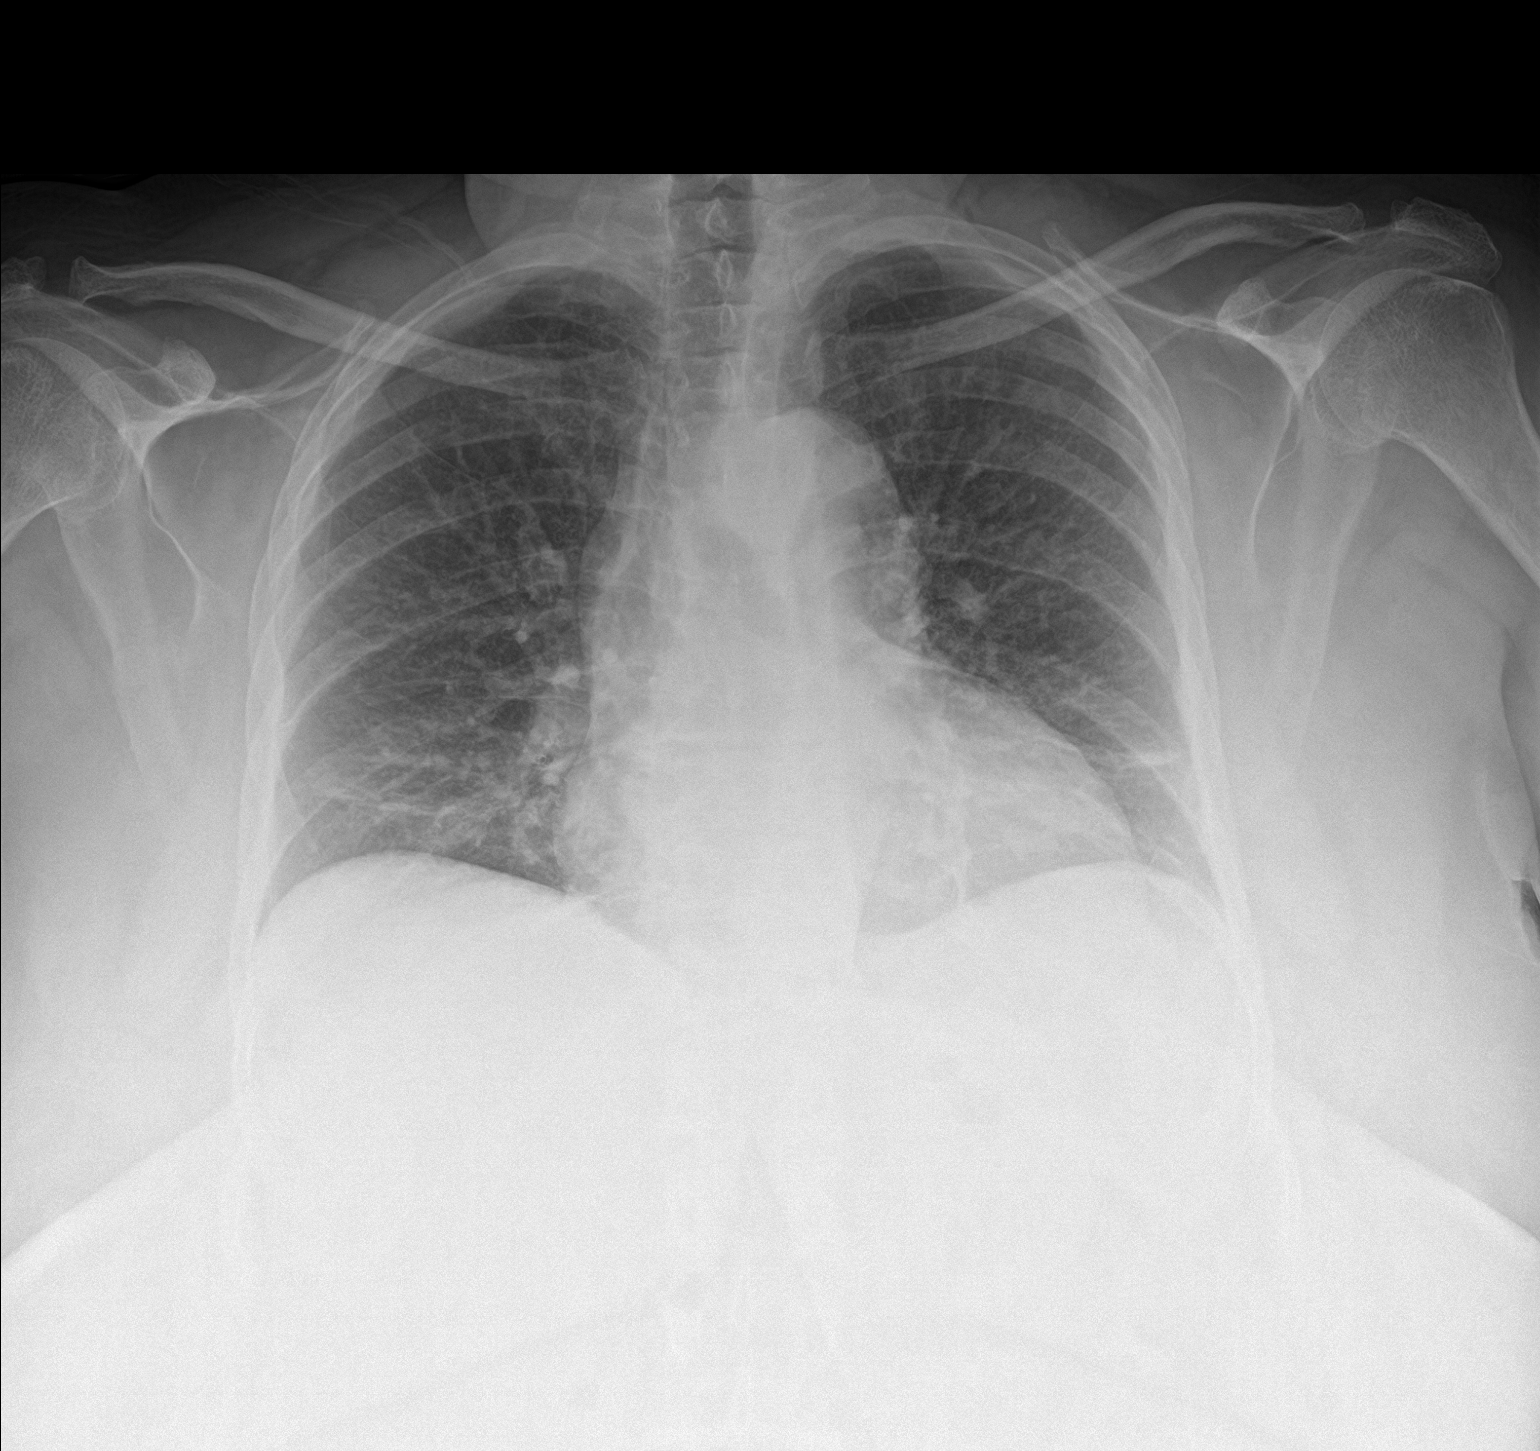

[chest lat]
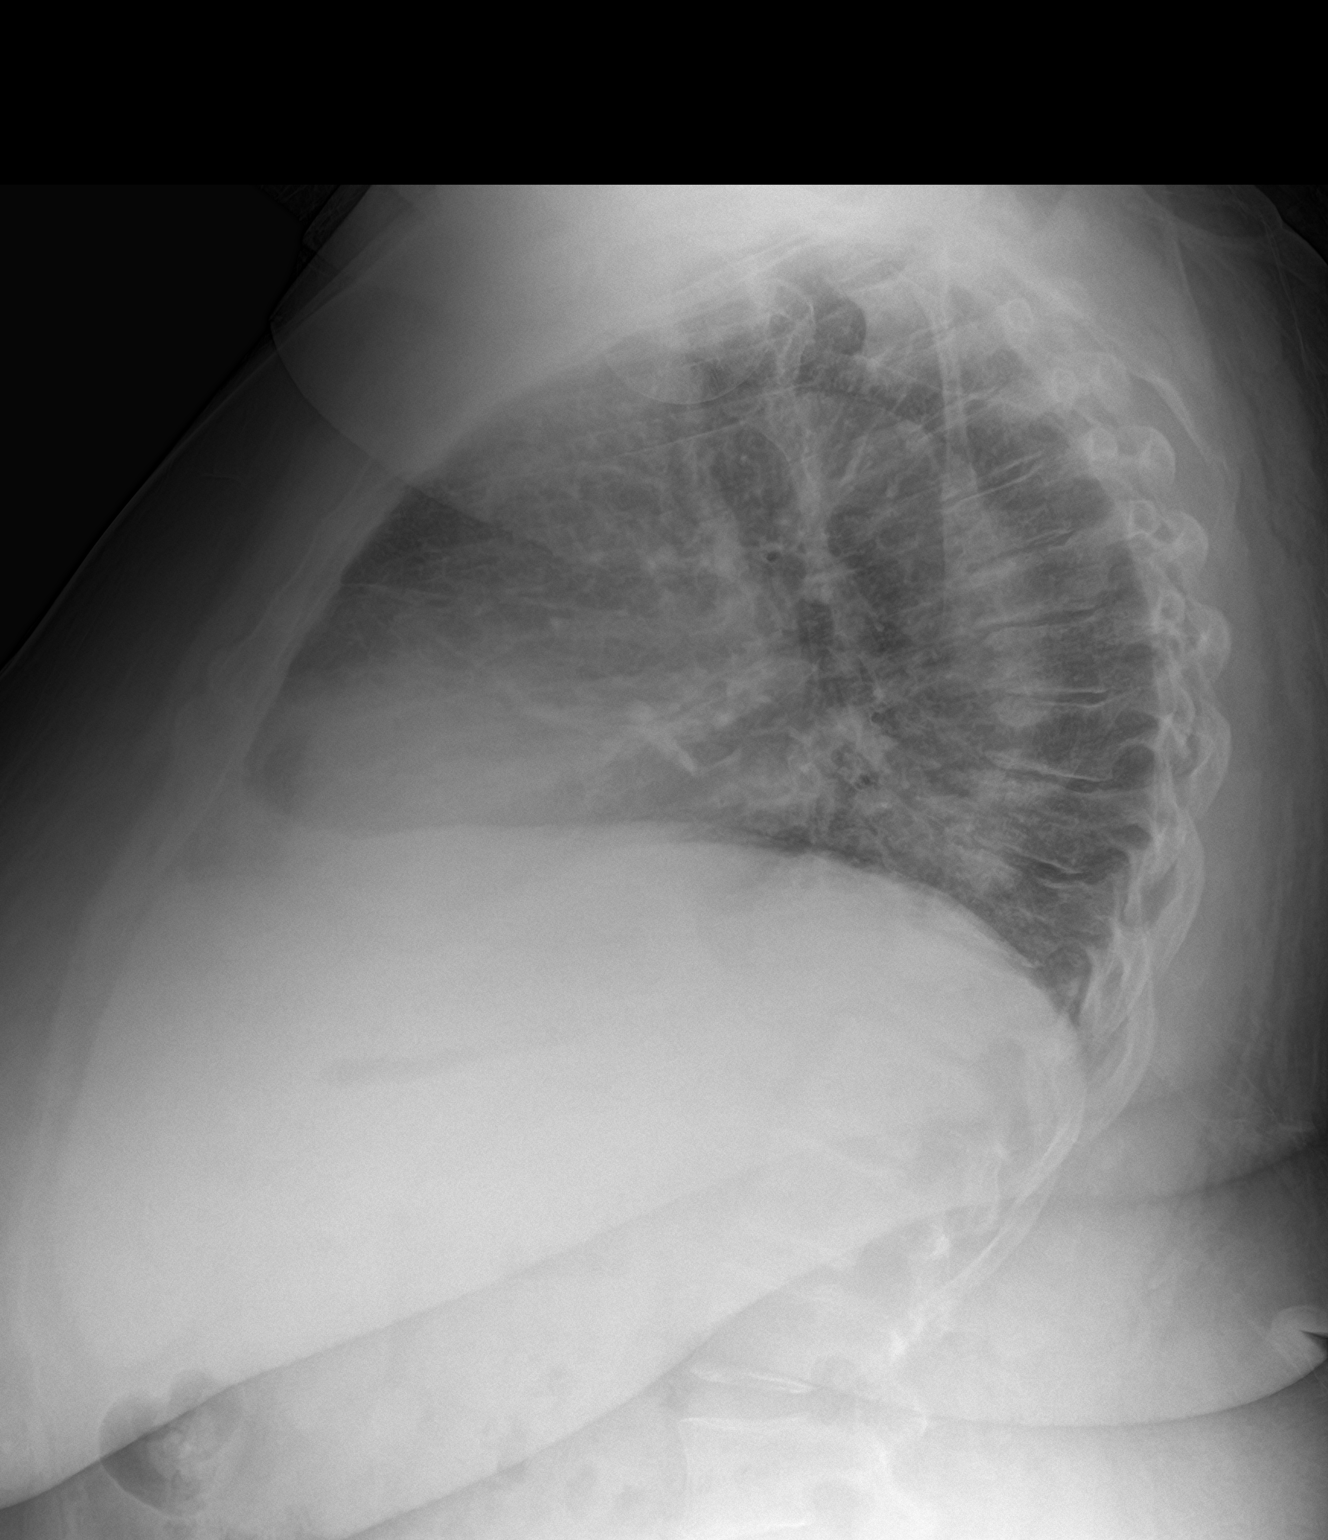

[2 of 2 positions shown; findings below may reference images not displayed]

FINDINGS: There is mild enlargement of the cardiac silhouette. There is mild
tortuosity of the thoracic aorta. Otherwise normal mediastinal
contour. No pneumothorax. No pleural effusion. No pulmonary edema.
Mild curvilinear opacities at both lung bases. No focal lung
consolidation. Surgical clips are noted in medial bilateral lower
neck.
IMPRESSION: 1. Mild enlargement of the cardiac silhouette, without pulmonary
edema. Cannot exclude pericardial effusion.
2. Mild curvilinear opacities at both lung bases, in keeping with
scarring and/or atelectasis. No focal lung consolidation.

## 2016-08-21 IMAGING — DX DG CHEST 2V
2 series · 2 of 2 positions shown · non-contrast
Comparison: [DATE].

CLINICAL DATA: 59-year-old female with chest pain and shortness
breath today. *INSERT* the [place]

EXAM:
CHEST  2 VIEW

[w chest lat]
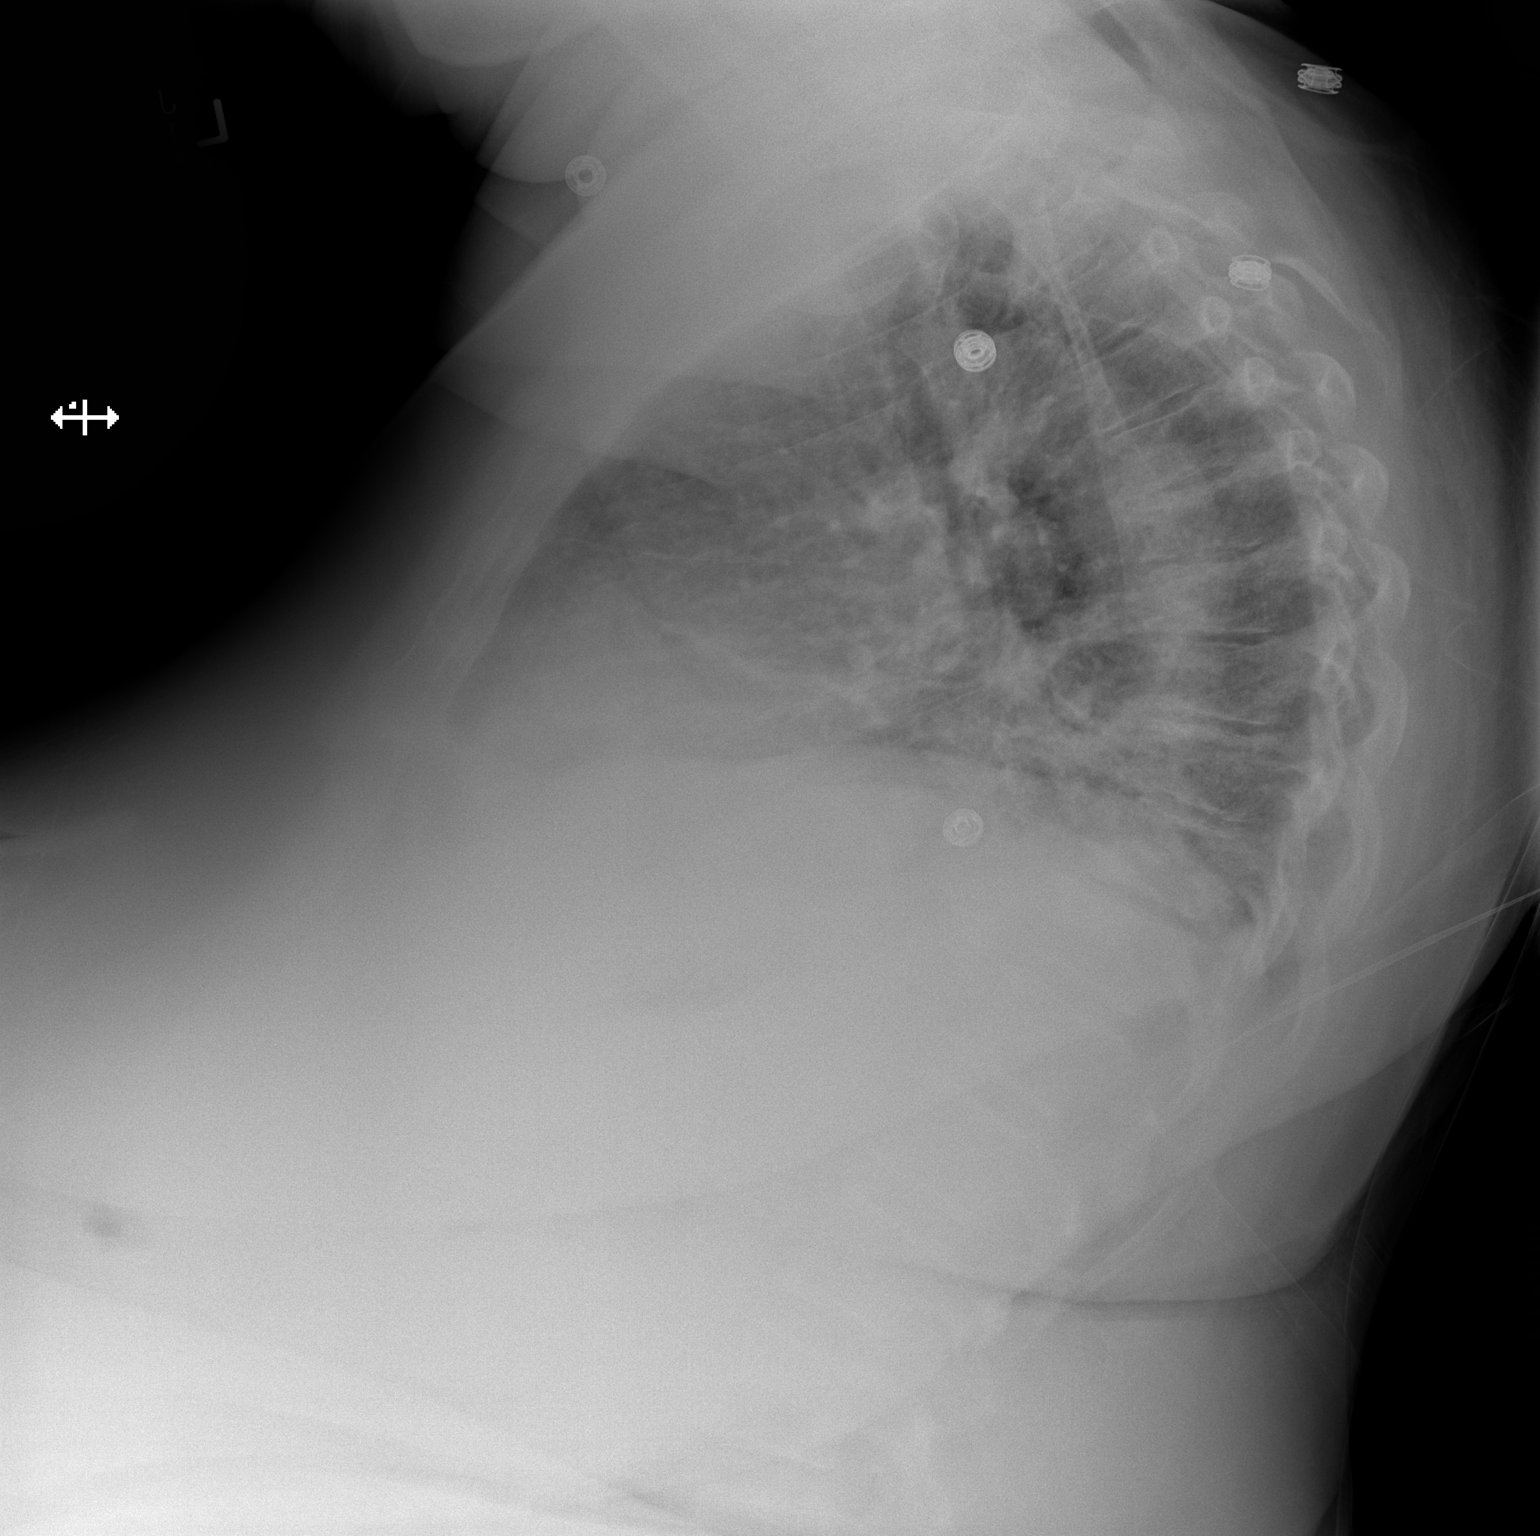

[x chest ap]
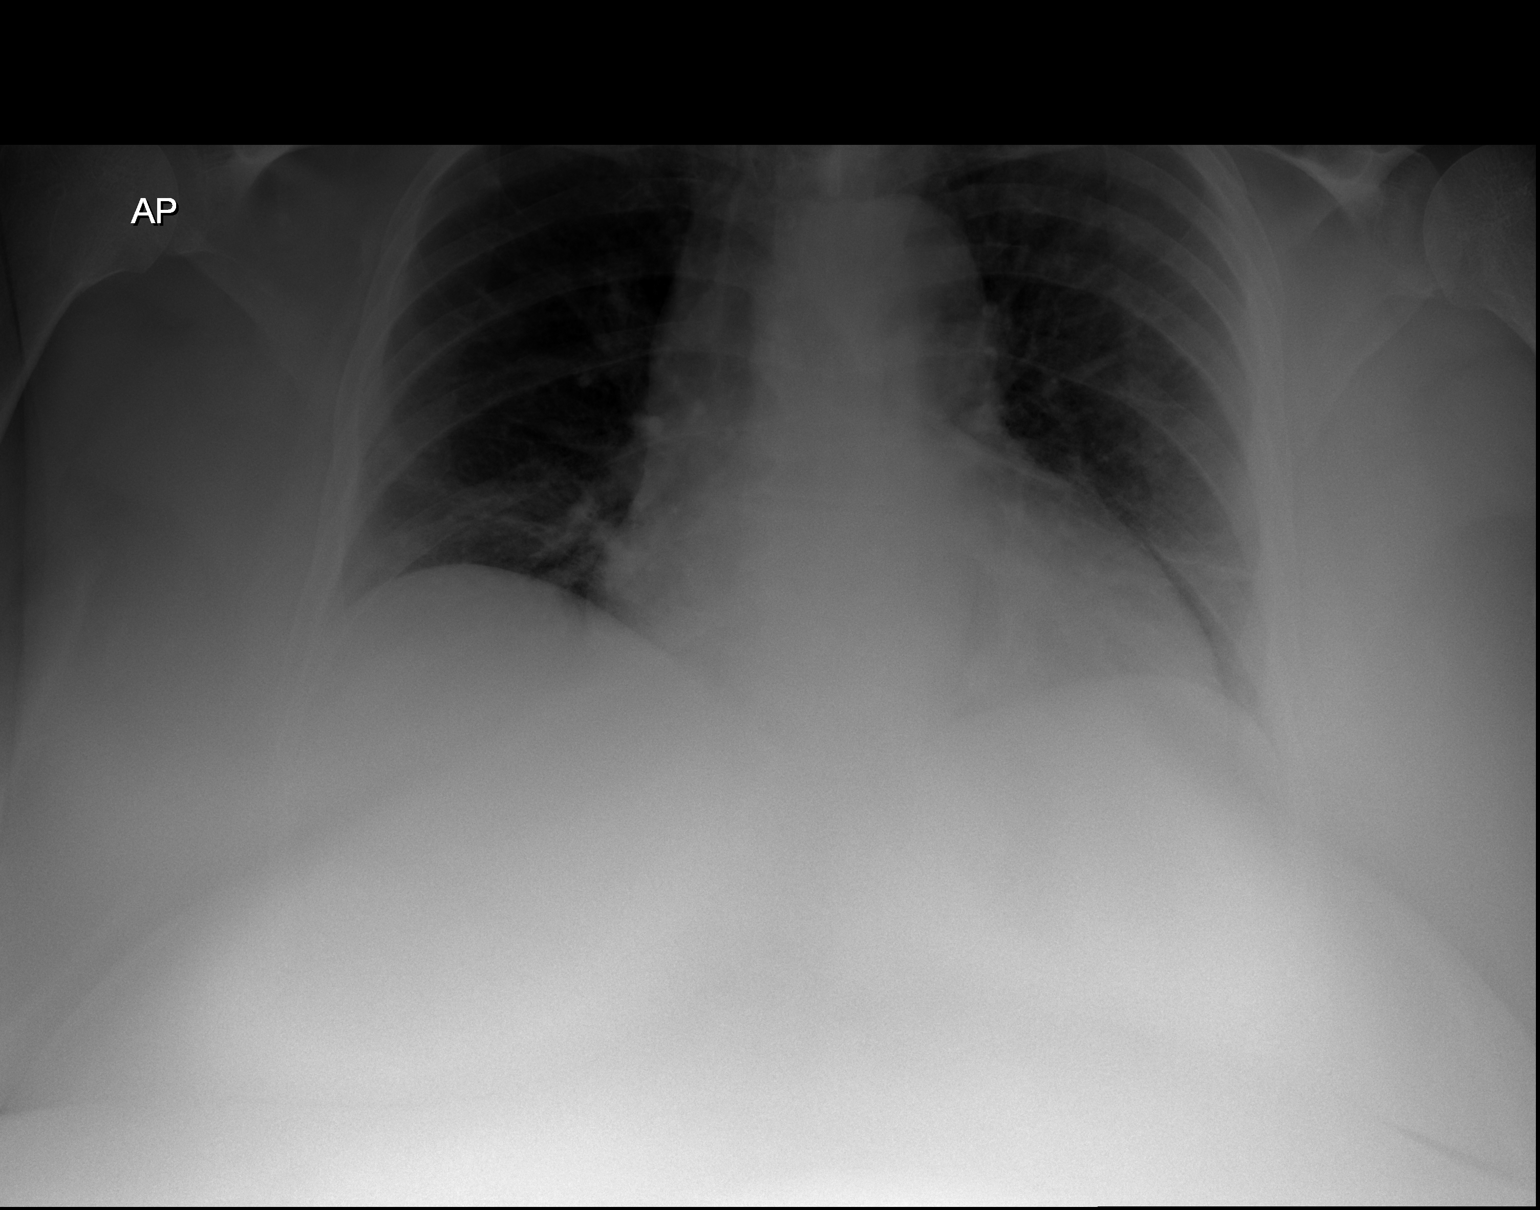

[2 of 2 positions shown; findings below may reference images not displayed]

FINDINGS: Seated upright AP and lateral views of the chest. Large body
habitus. Lower lung volumes. Stable cardiac size and mediastinal
contours. Continued perihilar linear opacity which most resembles
atelectasis. No pneumothorax, pulmonary edema or pleural effusion.
No other confluent pulmonary opacity. No acute osseous abnormality
identified.
IMPRESSION: Lower lung volumes with continued atelectasis.
